# Patient Record
Sex: Female | Born: 1985 | Race: White | Hispanic: No | Marital: Married | Smoking: Never smoker
Health system: Southern US, Community
[De-identification: ages and names within clinical notes are randomized; demographics above are authoritative.]

## PROBLEM LIST (undated history)

## (undated) DIAGNOSIS — F418 Other specified anxiety disorders: Secondary | ICD-10-CM

## (undated) DIAGNOSIS — E669 Obesity, unspecified: Secondary | ICD-10-CM

## (undated) DIAGNOSIS — I82409 Acute embolism and thrombosis of unspecified deep veins of unspecified lower extremity: Secondary | ICD-10-CM

## (undated) DIAGNOSIS — K219 Gastro-esophageal reflux disease without esophagitis: Secondary | ICD-10-CM

## (undated) DIAGNOSIS — I2699 Other pulmonary embolism without acute cor pulmonale: Secondary | ICD-10-CM

## (undated) DIAGNOSIS — D682 Hereditary deficiency of other clotting factors: Secondary | ICD-10-CM

## (undated) HISTORY — PX: PERIPHERAL VASCULAR THROMBECTOMY: CATH118306

## (undated) HISTORY — PX: INSERTION OF VENA CAVA FILTER: SHX5871

---

## 2015-03-17 DIAGNOSIS — I82409 Acute embolism and thrombosis of unspecified deep veins of unspecified lower extremity: Secondary | ICD-10-CM

## 2015-03-17 HISTORY — DX: Acute embolism and thrombosis of unspecified deep veins of unspecified lower extremity: I82.409

## 2015-04-07 ENCOUNTER — Encounter (HOSPITAL_COMMUNITY): Payer: Self-pay | Admitting: *Deleted

## 2015-04-07 ENCOUNTER — Inpatient Hospital Stay (HOSPITAL_COMMUNITY)
Admission: AD | Admit: 2015-04-07 | Discharge: 2015-04-12 | DRG: 175 | Disposition: A | Discharge status: Home / Self Care | Payer: Medicaid Other | Source: Other Acute Inpatient Hospital | Attending: Internal Medicine | Admitting: Internal Medicine

## 2015-04-07 DIAGNOSIS — I82431 Acute embolism and thrombosis of right popliteal vein: Secondary | ICD-10-CM | POA: Diagnosis present

## 2015-04-07 DIAGNOSIS — Z79899 Other long term (current) drug therapy: Secondary | ICD-10-CM

## 2015-04-07 DIAGNOSIS — R300 Dysuria: Secondary | ICD-10-CM | POA: Diagnosis present

## 2015-04-07 DIAGNOSIS — I2699 Other pulmonary embolism without acute cor pulmonale: Principal | ICD-10-CM | POA: Diagnosis present

## 2015-04-07 DIAGNOSIS — I82411 Acute embolism and thrombosis of right femoral vein: Secondary | ICD-10-CM | POA: Diagnosis not present

## 2015-04-07 DIAGNOSIS — R000 Tachycardia, unspecified: Secondary | ICD-10-CM | POA: Diagnosis present

## 2015-04-07 DIAGNOSIS — R0902 Hypoxemia: Secondary | ICD-10-CM | POA: Diagnosis present

## 2015-04-07 DIAGNOSIS — I82401 Acute embolism and thrombosis of unspecified deep veins of right lower extremity: Secondary | ICD-10-CM

## 2015-04-07 DIAGNOSIS — I82220 Acute embolism and thrombosis of inferior vena cava: Secondary | ICD-10-CM | POA: Diagnosis present

## 2015-04-07 DIAGNOSIS — R0600 Dyspnea, unspecified: Secondary | ICD-10-CM | POA: Diagnosis present

## 2015-04-07 DIAGNOSIS — Z793 Long term (current) use of hormonal contraceptives: Secondary | ICD-10-CM

## 2015-04-07 DIAGNOSIS — F418 Other specified anxiety disorders: Secondary | ICD-10-CM | POA: Diagnosis present

## 2015-04-07 DIAGNOSIS — Z6841 Body Mass Index (BMI) 40.0 and over, adult: Secondary | ICD-10-CM | POA: Diagnosis not present

## 2015-04-07 DIAGNOSIS — R0602 Shortness of breath: Secondary | ICD-10-CM | POA: Diagnosis not present

## 2015-04-07 DIAGNOSIS — I82409 Acute embolism and thrombosis of unspecified deep veins of unspecified lower extremity: Secondary | ICD-10-CM | POA: Diagnosis present

## 2015-04-07 DIAGNOSIS — I471 Supraventricular tachycardia: Secondary | ICD-10-CM | POA: Diagnosis not present

## 2015-04-07 HISTORY — DX: Gastro-esophageal reflux disease without esophagitis: K21.9

## 2015-04-07 HISTORY — DX: Other specified anxiety disorders: F41.8

## 2015-04-07 HISTORY — DX: Obesity, unspecified: E66.9

## 2015-04-07 MED ORDER — BUPROPION HCL ER (XL) 150 MG PO TB24
150.0000 mg | ORAL_TABLET | Freq: Every day | ORAL | Status: DC
Start: 2015-04-07 — End: 2015-04-12
  Administered 2015-04-08 – 2015-04-12 (×5): 150 mg via ORAL
  Filled 2015-04-07 (×5): qty 1

## 2015-04-07 MED ORDER — SERTRALINE HCL 100 MG PO TABS
100.0000 mg | ORAL_TABLET | Freq: Every day | ORAL | Status: DC
  Administered 2015-04-08 – 2015-04-12 (×5): 100 mg via ORAL
  Filled 2015-04-07 (×5): qty 1

## 2015-04-07 MED ORDER — SODIUM CHLORIDE 0.9 % IJ SOLN
3.0000 mL | Freq: Two times a day (BID) | INTRAMUSCULAR | Status: DC
  Administered 2015-04-07 – 2015-04-12 (×6): 3 mL via INTRAVENOUS

## 2015-04-07 MED ORDER — MAGNESIUM CITRATE PO SOLN
1.0000 | Freq: Once | ORAL | Status: AC
  Administered 2015-04-07: 1 via ORAL
  Filled 2015-04-07: qty 296

## 2015-04-07 MED ORDER — SODIUM CHLORIDE 0.9 % IV SOLN
INTRAVENOUS | Status: AC
  Administered 2015-04-07 – 2015-04-08 (×2): via INTRAVENOUS
  Administered 2015-04-08: 100 mL/h via INTRAVENOUS

## 2015-04-07 MED ORDER — HYDROMORPHONE HCL 1 MG/ML IJ SOLN
1.0000 mg | Freq: Once | INTRAMUSCULAR | Status: AC
  Administered 2015-04-07: 1 mg via INTRAVENOUS

## 2015-04-07 MED ORDER — ONDANSETRON HCL 4 MG/2ML IJ SOLN
4.0000 mg | Freq: Four times a day (QID) | INTRAMUSCULAR | Status: DC | PRN
  Administered 2015-04-07: 4 mg via INTRAVENOUS
  Filled 2015-04-07 (×2): qty 2

## 2015-04-07 MED ORDER — OXYCODONE HCL 5 MG PO TABS
5.0000 mg | ORAL_TABLET | ORAL | Status: DC | PRN
  Administered 2015-04-08 – 2015-04-12 (×13): 5 mg via ORAL
  Filled 2015-04-07 (×13): qty 1

## 2015-04-07 MED ORDER — HYDROMORPHONE HCL 1 MG/ML IJ SOLN
1.0000 mg | Freq: Once | INTRAMUSCULAR | Status: DC
  Filled 2015-04-07 (×2): qty 1

## 2015-04-07 MED ORDER — NITROFURANTOIN MONOHYD MACRO 100 MG PO CAPS
100.0000 mg | ORAL_CAPSULE | Freq: Two times a day (BID) | ORAL | Status: DC
  Administered 2015-04-08 – 2015-04-12 (×10): 100 mg via ORAL
  Filled 2015-04-07 (×13): qty 1

## 2015-04-07 MED ORDER — ENOXAPARIN SODIUM 150 MG/ML ~~LOC~~ SOLN
130.0000 mg | Freq: Two times a day (BID) | SUBCUTANEOUS | Status: DC
  Administered 2015-04-08: 130 mg via SUBCUTANEOUS
  Filled 2015-04-07 (×3): qty 1

## 2015-04-07 NOTE — H&P (Signed)
[facility] History and Physical  Yenifer Saccente AOZ:[id number] DOB: 17-Dec-1985 DOA: 04/07/2015  Referring physician: Transfer from [facility]. PCP: Gaye Alken, NP   Chief Complaint:   Right leg pain and swelling.  HPI: Kryssa Risenhoover is a 29 y.o. female with a past medical history of depression, anxiety, morbid obesity, who was transferred from [facility] for further management of pulmonary embolism and DVT. The patient states that since Wednesday last week, she started having right lower extremity pain, which got increasingly worse, then later was associated with pleuritic chest pain and dyspnea. 2 days ago, on Saturday, she went to [facility] and was diagnosed with the above-mentioned thromboembolic events.  She denies any recent traveling by plane or automobile, cigarette smoking, but she has 2 second-degree relatives with a history of thromboembolic events. She is also on hormonal-based birth control tablets. 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  When seen she was in no acute distress and denies any other new complaints.   Review of Systems:  Constitutional:  No weight loss, night sweats, Fevers, chills,  Positive fatigue.  HEENT:  No headaches, Difficulty swallowing,Tooth/dental problems,Sore throat,  No sneezing, itching, ear ache, nasal congestion, post nasal drip,  Cardio-vascular:  No chest pain, Orthopnea, PND, swelling in lower extremities, anasarca, dizziness, palpitations  GI:  No heartburn, indigestion, abdominal pain, nausea, vomiting, diarrhea, change in bowel habits, loss of appetite  Resp:  No shortness of breath with exertion or at rest. No excess mucus, no productive cough, No non-productive cough, No coughing up of blood.No change in color of mucus.No wheezing.No chest wall deformity  Skin:  no rash or lesions.  GU:  no dysuria, change in color of urine, no urgency or frequency. No  flank pain.  Musculoskeletal:  No joint pain or swelling. No decreased range of motion. No back pain.  Psych:  No change in mood or affect. No depression or anxiety. No memory loss.   History reviewed. No pertinent past medical history. History reviewed. No pertinent past surgical history. Social History:  reports that she has never smoked. She does not have any smokeless tobacco history on file. She reports that she does not drink alcohol or use illicit drugs.    History reviewed.  Glucose intolerance on her mother.  Prior to Admission medications   Medication Sig Start Date End Date Taking? Authorizing Provider  buPROPion (WELLBUTRIN XL) 150 MG 24 hr tablet Take 150 mg by mouth daily.   Yes Historical Provider, MD  sertraline (ZOLOFT) 100 MG tablet Take 100 mg by mouth daily.   Yes Historical Provider, MD   Physical Exam: Filed Vitals:   04/07/15 1909  BP: 127/72  Pulse: 130  Temp: 99.5 F (37.5 C)  TempSrc: Oral  Resp: 18  SpO2: 97%    Wt Readings from Last 3 Encounters:  No data found for Wt    General:  Appears calm and comfortable Eyes: PERRL, normal lids, irises & conjunctiva ENT: grossly normal hearing, lips & tongue mildly dry. Neck: no LAD, masses or thyromegaly Cardiovascular: Tachycardic, no m/r/g. No LE edema. Telemetry: Tachycardic at 120 bpm Respiratory: CTA bilaterally, no w/r/r. Normal respiratory effort. Abdomen: Obese, soft, positive suprapubic tenderness on palpation without guarding or rebound tenderness. Skin: no rash or induration seen on limited exam Musculoskeletal: grossly normal tone BUE/BLE Psychiatric: grossly normal mood and affect, speech fluent and appropriate Neurologic: grossly non-focal.  Labs on Admission:  04/05/2011 at [facility] WBC 11.5 Hemoglobin 11.5 Hematocrit 35.5 Platelets 255 Differential is unremarkable.  CMP had a total protein of 8.4 g/dL, but he was otherwise unremarkable. Urine analysis  showed bacteriuria.  Right lower extremity ultrasound. Positive for an extensive occlusive DVT in the right common femoral and popliteal veins.  CT angiogram of the chest. Pulmonary embolus involving the right main pulmonary artery extending into the segmental and subsegmental pulmonary arteries of the right upper, middle and lower lobes.  CTA of abdomen and pelvis. 1) Problem list small focus of nonocclusive thrombus in the IVC just below the level of the renal veins. 2) Right lower extremity DVT extends into the external iliac vein with nonocclusive thrombus visualized up to the level of the mid to proximal sternal iliac vein. 3) slight enlargement of small right pleural effusion. Stable peripheral consolidation in the posterior right lower lobe again likely reflects a small pulmonary infarct related to acute pulmonary embolism.   Echocardiogram at [facility] was normal.  EKG Normal sinus rhythm. Normal EKG.  Assessment/Plan Principal Problem:   Pulmonary embolism and infarction   DVT of lower extremity (deep venous thrombosis) The patient states that the Eliquis is not working, since she seems to be getting more edema and pain than what she had on admission Saturday. I will discontinue Eliquis and start subcutaneous Lovenox at therapeutic dose. Supplemental oxygen.  Telemetry monitoring. Per patient, the workup for hypercoagulable state was already sent by [facility] and should be available in several days. Consult pulmonology in a.m. to evaluate possible lung infarction. She should discontinue her current birth control tablets and consider nonhormonal based methods.  Active Problems:   Depression with anxiety Stable at this time. Continue Zoloft and bupropion.    Morbid obesity Patient stated that she has been losing weight, but she will redouble her efforts to lose more weight. This should decrease the amount of peripheral estrogen levels and decrease her risk  of more thromboembolic events in the future.    Dysuria I will start nitrofurantoin.     Code Status: Full code. DVT Prophylaxis: On therapeutic anticoagulation. Family Communication: Eliza, Green [phone]  Disposition Plan: Anticoagulation with Lovenox, consult pulmonology and discharge home once bridged to an oral anticoagulant.  Time spent: Over 60 minutes.  Bobette Mo [facility] Pager [phone].

## 2015-04-07 NOTE — Progress Notes (Signed)
Jamye Balicki [id number] Admission Data: 04/07/2015 6:45 PM Attending Provider: Marinda Elk, MD  WUJ:WJXB,JYN, NP Consults/ Treatment Team:    Shantal Roan is a 29 y.o. female patient admitted from ED awake, alert  & orientated  X 3,  No Order, VSS - Last menstrual period 03/17/2015.,no c/o shortness of breath, no c/o chest pain, no distress noted.    IV site WDL:  antecubital right, condition patent and no redness with a transparent dsg that's clean dry and intact.  Allergies:  Not on File   History reviewed. No pertinent past medical history.  History:  obtained from the patient. Tobacco/alcohol: denied none  Pt orientation to unit, room and routine. Information packet given to patient/family and safety video watched.  Admission INP armband ID verified with patient/family, and in place. SR up x 2, fall risk assessment complete with Patient and family verbalizing understanding of risks associated with falls. Pt verbalizes an understanding of how to use the call bell and to call for help before getting out of bed.  Skin, clean-dry- intact without evidence of bruising, or skin tears.   No evidence of skin break down noted on exam just bruises.     Will cont to monitor and assist as needed.  Jahkai Yandell Consuella Lose, RN 04/07/2015 6:45 PM

## 2015-04-08 ENCOUNTER — Inpatient Hospital Stay (HOSPITAL_COMMUNITY): Payer: Medicaid Other

## 2015-04-08 ENCOUNTER — Encounter (HOSPITAL_COMMUNITY): Payer: Self-pay | Admitting: Radiology

## 2015-04-08 DIAGNOSIS — R0902 Hypoxemia: Secondary | ICD-10-CM | POA: Diagnosis present

## 2015-04-08 DIAGNOSIS — R000 Tachycardia, unspecified: Secondary | ICD-10-CM | POA: Diagnosis present

## 2015-04-08 DIAGNOSIS — R300 Dysuria: Secondary | ICD-10-CM

## 2015-04-08 DIAGNOSIS — R0602 Shortness of breath: Secondary | ICD-10-CM | POA: Diagnosis present

## 2015-04-08 DIAGNOSIS — I471 Supraventricular tachycardia: Secondary | ICD-10-CM

## 2015-04-08 DIAGNOSIS — I82409 Acute embolism and thrombosis of unspecified deep veins of unspecified lower extremity: Secondary | ICD-10-CM | POA: Diagnosis present

## 2015-04-08 LAB — COMPREHENSIVE METABOLIC PANEL
ALK PHOS: 84 U/L (ref 38–126)
ALT: 14 U/L (ref 14–54)
ANION GAP: 10 (ref 5–15)
AST: 29 U/L (ref 15–41)
Albumin: 2.3 g/dL — ABNORMAL LOW (ref 3.5–5.0)
BUN: <5 mg/dL — ABNORMAL LOW (ref 6–20)
CALCIUM: 8.8 mg/dL — AB (ref 8.9–10.3)
CO2: 25 mmol/L (ref 22–32)
Chloride: 101 mmol/L (ref 101–111)
Creatinine, Ser: 0.83 mg/dL (ref 0.44–1.00)
Glucose, Bld: 105 mg/dL — ABNORMAL HIGH (ref 65–99)
Potassium: 3.9 mmol/L (ref 3.5–5.1)
SODIUM: 136 mmol/L (ref 135–145)
TOTAL PROTEIN: 7.1 g/dL (ref 6.5–8.1)
Total Bilirubin: 0.4 mg/dL (ref 0.3–1.2)

## 2015-04-08 LAB — HEPARIN LEVEL (UNFRACTIONATED)
Heparin Unfractionated: >2.2 IU/mL — ABNORMAL HIGH (ref 0.30–0.70)
Heparin Unfractionated: 1.84 [IU]/mL — ABNORMAL HIGH (ref 0.30–0.70)

## 2015-04-08 LAB — URINALYSIS, ROUTINE W REFLEX MICROSCOPIC
Bilirubin Urine: NEGATIVE
GLUCOSE, UA: NEGATIVE mg/dL
HGB URINE DIPSTICK: NEGATIVE
Ketones, ur: 40 mg/dL — AB
LEUKOCYTES UA: NEGATIVE
Nitrite: NEGATIVE
Protein, ur: NEGATIVE mg/dL
SPECIFIC GRAVITY, URINE: 1.034 — AB (ref 1.005–1.030)
Urobilinogen, UA: 0.2 mg/dL (ref 0.0–1.0)
pH: 7.5 (ref 5.0–8.0)

## 2015-04-08 LAB — CBC
HCT: 32 % — ABNORMAL LOW (ref 36.0–46.0)
HCT: 31.1 % — ABNORMAL LOW (ref 36.0–46.0)
HCT: 31.3 % — ABNORMAL LOW (ref 36.0–46.0)
HEMOGLOBIN: 10.1 g/dL — AB (ref 12.0–15.0)
Hemoglobin: 9.8 g/dL — ABNORMAL LOW (ref 12.0–15.0)
Hemoglobin: 9.9 g/dL — ABNORMAL LOW (ref 12.0–15.0)
MCH: 27 pg (ref 26.0–34.0)
MCH: 26.9 pg (ref 26.0–34.0)
MCH: 27 pg (ref 26.0–34.0)
MCHC: 31.6 g/dL (ref 30.0–36.0)
MCHC: 31.5 g/dL (ref 30.0–36.0)
MCHC: 31.6 g/dL (ref 30.0–36.0)
MCV: 85.6 fL (ref 78.0–100.0)
MCV: 85.4 fL (ref 78.0–100.0)
MCV: 85.5 fL (ref 78.0–100.0)
PLATELETS: 278 10*3/uL (ref 150–400)
Platelets: 254 10*3/uL (ref 150–400)
Platelets: 257 10*3/uL (ref 150–400)
RBC: 3.74 MIL/uL — AB (ref 3.87–5.11)
RBC: 3.64 MIL/uL — ABNORMAL LOW (ref 3.87–5.11)
RBC: 3.66 MIL/uL — ABNORMAL LOW (ref 3.87–5.11)
RDW: 13.6 % (ref 11.5–15.5)
RDW: 13.4 % (ref 11.5–15.5)
RDW: 13.4 % (ref 11.5–15.5)
WBC: 11.1 10*3/uL — AB (ref 4.0–10.5)
WBC: 10.8 10*3/uL — ABNORMAL HIGH (ref 4.0–10.5)
WBC: 12.5 10*3/uL — ABNORMAL HIGH (ref 4.0–10.5)

## 2015-04-08 LAB — FIBRINOGEN
FIBRINOGEN: 689 mg/dL — AB (ref 204–475)
Fibrinogen: 722 mg/dL — ABNORMAL HIGH (ref 204–475)
Fibrinogen: 697 mg/dL — ABNORMAL HIGH (ref 204–475)

## 2015-04-08 LAB — MRSA PCR SCREENING: MRSA BY PCR: NEGATIVE

## 2015-04-08 LAB — APTT
APTT: 42 s — AB (ref 24–37)
APTT: 43 s — AB (ref 24–37)

## 2015-04-08 LAB — GLUCOSE, CAPILLARY: Glucose-Capillary: 90 mg/dL (ref 65–99)

## 2015-04-08 LAB — PROTIME-INR
INR: 1.43 (ref 0.00–1.49)
Prothrombin Time: 17.6 seconds — ABNORMAL HIGH (ref 11.6–15.2)

## 2015-04-08 MED ORDER — PANTOPRAZOLE SODIUM 40 MG PO TBEC
40.0000 mg | DELAYED_RELEASE_TABLET | Freq: Every day | ORAL | Status: DC
  Administered 2015-04-08 – 2015-04-12 (×5): 40 mg via ORAL
  Filled 2015-04-08 (×5): qty 1

## 2015-04-08 MED ORDER — MORPHINE SULFATE (PF) 2 MG/ML IV SOLN
1.0000 mg | INTRAVENOUS | Status: DC | PRN
  Administered 2015-04-08 – 2015-04-12 (×5): 2 mg via INTRAVENOUS
  Filled 2015-04-08 (×5): qty 1

## 2015-04-08 MED ORDER — LIDOCAINE HCL 1 % IJ SOLN
INTRAMUSCULAR | Status: AC
  Filled 2015-04-08: qty 20

## 2015-04-08 MED ORDER — SODIUM CHLORIDE 0.9 % IJ SOLN
3.0000 mL | Freq: Two times a day (BID) | INTRAMUSCULAR | Status: DC
  Administered 2015-04-08 – 2015-04-12 (×7): 3 mL via INTRAVENOUS

## 2015-04-08 MED ORDER — HYDROMORPHONE HCL 1 MG/ML IJ SOLN
INTRAMUSCULAR | Status: AC
  Administered 2015-04-08: 1 mg
  Filled 2015-04-08: qty 1

## 2015-04-08 MED ORDER — SODIUM CHLORIDE 0.9 % IV SOLN: 250.0000 mL | INTRAVENOUS | Status: DC | PRN

## 2015-04-08 MED ORDER — IOHEXOL 300 MG/ML  SOLN
100.0000 mL | Freq: Once | INTRAMUSCULAR | Status: DC | PRN
  Administered 2015-04-08: 60 mL via INTRAVENOUS
  Filled 2015-04-08: qty 100

## 2015-04-08 MED ORDER — HEPARIN (PORCINE) IN NACL 100-0.45 UNIT/ML-% IJ SOLN
1900.0000 [IU]/h | INTRAMUSCULAR | Status: DC
  Administered 2015-04-08: 1000 [IU]/h via INTRAVENOUS
  Administered 2015-04-09: 1600 [IU]/h via INTRAVENOUS
  Administered 2015-04-09: 1750 [IU]/h via INTRAVENOUS
  Administered 2015-04-10: 1900 [IU]/h via INTRAVENOUS
  Filled 2015-04-08 (×8): qty 250

## 2015-04-08 MED ORDER — FENTANYL CITRATE (PF) 100 MCG/2ML IJ SOLN
INTRAMUSCULAR | Status: AC
  Administered 2015-04-08: 17:00:00
  Filled 2015-04-08: qty 2

## 2015-04-08 MED ORDER — FENTANYL CITRATE (PF) 100 MCG/2ML IJ SOLN
INTRAMUSCULAR | Status: AC
  Filled 2015-04-08: qty 2

## 2015-04-08 MED ORDER — ALUM & MAG HYDROXIDE-SIMETH 200-200-20 MG/5ML PO SUSP
30.0000 mL | Freq: Four times a day (QID) | ORAL | Status: DC | PRN
  Administered 2015-04-08: 30 mL via ORAL
  Filled 2015-04-08 (×2): qty 30

## 2015-04-08 MED ORDER — FENTANYL CITRATE (PF) 100 MCG/2ML IJ SOLN
INTRAMUSCULAR | Status: AC | PRN
  Administered 2015-04-08 (×2): 50 ug via INTRAVENOUS

## 2015-04-08 MED ORDER — SODIUM CHLORIDE 0.9 % IV SOLN
INTRAVENOUS | Status: DC
  Administered 2015-04-08 – 2015-04-09 (×3): via INTRAVENOUS

## 2015-04-08 MED ORDER — SODIUM CHLORIDE 0.9 % IJ SOLN: 3.0000 mL | INTRAMUSCULAR | Status: DC | PRN

## 2015-04-08 MED ORDER — MIDAZOLAM HCL 2 MG/2ML IJ SOLN
INTRAMUSCULAR | Status: AC | PRN
  Administered 2015-04-08 (×2): 1 mg via INTRAVENOUS

## 2015-04-08 MED ORDER — SODIUM CHLORIDE 0.9 % IV SOLN: INTRAVENOUS | Status: DC

## 2015-04-08 MED ORDER — SODIUM CHLORIDE 0.9 % IV SOLN
1.0000 mg/h | Freq: Once | INTRAVENOUS | Status: DC
  Administered 2015-04-08: 1 mg/h via INTRAVENOUS
  Filled 2015-04-08: qty 24

## 2015-04-08 MED ORDER — MIDAZOLAM HCL 2 MG/2ML IJ SOLN
INTRAMUSCULAR | Status: AC
  Filled 2015-04-08: qty 2

## 2015-04-08 MED ORDER — HEPARIN SODIUM (PORCINE) 1000 UNIT/ML IJ SOLN
INTRAMUSCULAR | Status: AC
  Filled 2015-04-08: qty 1

## 2015-04-08 NOTE — Progress Notes (Signed)
[facility] PROGRESS NOTE  Christina Simmons WUJ:[id number] DOB: [birth year] DOA: 04/07/2015 PCP: Gaye Alken, NP  Brief narrative 29 year old morbidly obese female with history of anxiety depression, on OCP for past 15 years was transferred from [facility] after being admitted there on 8/24 with symptoms of dyspnea and pleuritic chest pain extensive right lower leg DVT (extending into the external iliac along with a nonocclusive thrombus of the lower IVC), extensive right sided PE (right main and subsegmental branches with right lower lobe infarct and small left lower lobe PE). Patient initially placed on IV heparin and started on eliquis. Family history of blood clots in her grandparents. CT evaluated by radiologist Dr Fredia Sorrow and [facility]  Patient on admission placed on therapeutic lovenox. Patient since admission denies any dyspnea but is tachycardic  from 120-150 on the monitor overnight. Repeat blood work pending. Reportedly hypercoagulable workup has been sent at [facility].  2 D ECHO done at [facility] show any rt heart strain.   Assessment/Plan: Extensive right lower leg DVT with bilateral PE (R>>L) Patient tachycardic in 120s-130s this morning. We'll transfer her to ICU with plan on thrombolysis of extensive right lower leg DVT and possible IVC placement. Hold off on further lovenox dosing. D/w PCCM( Dr Craige Cotta) , agree with transfer to ICU. D/w IR on planned procedure. Pain control with when necessary oxycodone and IV  morphine .  Active problems Anxiety and depression Resume home medications  Dysuria Started on nitrofurantoin. Check UA and urine culture.  Morbid obesity  Diet: Nothing by mouth    Code Status: Full code Family Communication: Husband at bedside Disposition Plan: Transfer to ICU   Consultants:  PCCM   IR  Procedures:  For tPA of extensive DVT/  ?IVC  Antibiotics:  nitrofurantoin  HPI/Subjective: Admission H&P reviewed. Patient complains of severe pain in her right leg and right lower quadrant. Denies shortness of breath but has been tachycardic on the monitor.  Objective: Filed Vitals:   04/07/15 2143  BP: 125/70  Pulse: 125  Temp: 99.6 F (37.6 C)  Resp:     Intake/Output Summary (Last 24 hours) at 04/08/15 9562 Last data filed at 04/08/15 0700  Gross per 24 hour  Intake      0 ml  Output    400 ml  Net   -400 ml   Filed Weights   04/07/15 2245 04/08/15 0135  Weight: 129.275 kg (285 lb) 129.275 kg (285 lb)    Exam:   General:  Young obese female in some distress with pain  HEENT: No pallor, moist oral mucosa  Chest: Clear to auscultation bilaterally  CVS: S1 and S2 tachycardic, no murmurs rub or gallop  GI: Soft, nondistended, right lower quadrant tenderness, bowel sounds present  Musculoskeletal: Swollen right leg with tender to pressure over the thigh and the calves, distal pulses palpable  CNS: Alert and oriented    Data Reviewed: Basic Metabolic Panel: No results for input(s): NA, K, CL, CO2, GLUCOSE, BUN, CREATININE, CALCIUM, MG, PHOS in the last 168 hours. Liver Function Tests: No results for input(s): AST, ALT, ALKPHOS, BILITOT, PROT, ALBUMIN in the last 168 hours. No results for input(s): LIPASE, AMYLASE in the last 168 hours. No results for input(s): AMMONIA in the last 168 hours. CBC: No results for input(s): WBC, NEUTROABS, HGB, HCT, MCV, PLT in the last 168 hours. Cardiac Enzymes: No results for input(s): CKTOTAL, CKMB, CKMBINDEX, TROPONINI in the last 168 hours. BNP (last 3 results) No results  for input(s): BNP in the last 8760 hours.  ProBNP (last 3 results) No results for input(s): PROBNP in the last 8760 hours.  CBG: No results for input(s): GLUCAP in the last 168 hours.  No results found for this or any previous visit (from the past 240 hour(s)).   Studies: No  results found.  Scheduled Meds: . buPROPion  150 mg Oral Daily  . enoxaparin (LOVENOX) injection  130 mg Subcutaneous BID  .  HYDROmorphone (DILAUDID) injection  1 mg Intravenous Once  . nitrofurantoin (macrocrystal-monohydrate)  100 mg Oral Q12H  . pantoprazole  40 mg Oral Daily  . sertraline  100 mg Oral Daily  . sodium chloride  3 mL Intravenous Q12H   Continuous Infusions: . sodium chloride 100 mL/hr at 04/07/15 2147    Principal Problem:   Pulmonary embolism and infarction Active Problems:   DVT of lower extremity (deep venous thrombosis)   Depression with anxiety   Morbid obesity   Dysuria    Time spent: 35 minutes    Othel Hoogendoorn  [facility] Pager [phone] If 7PM-7AM, please contact night-coverage at [email or web address], password [facility] 04/08/2015, 8:29 AM  LOS: 1 day

## 2015-04-08 NOTE — Progress Notes (Signed)
Utilization review completed. Crimson Dubberly, RN, BSN. 

## 2015-04-08 NOTE — Sedation Documentation (Signed)
Patient is resting comfortably. 

## 2015-04-08 NOTE — Sedation Documentation (Signed)
Patient c/o pain in R leg

## 2015-04-08 NOTE — Procedures (Signed)
Successful insertion of an EKOS [place] assisted infusion catheter into the right leg DVT via popliteal vein for thrombolysis No comp Stable Begin at /hr followup venogram after at least 18 hrs of lysis Full report in PACS

## 2015-04-08 NOTE — Progress Notes (Signed)
Patient made NPO.  Explain that radiology was going to come up and consent her for the procedure patient verbalize understanding

## 2015-04-08 NOTE — Progress Notes (Signed)
ANTICOAGULATION CONSULT NOTE - Follow Up  Pharmacy Consult for Heparin Indication: pulmonary embolus and DVT for thrombolysis  Allergies  Allergen Reactions  . Amoxicillin     Childhood    Patient Measurements: Height:  (162.6 cm) Weight: 293 lb 10.4 oz (133.2 kg) IBW/kg (Calculated) : 54.7 Heparin Dosing Weight: 86.6 kg  Vital Signs: Temp: 98.7 F (37.1 C) (08/23 1111) Temp Source: Oral (08/23 1111) BP: 118/63 mmHg (08/23 1900) Pulse Rate: 115 (08/23 1900)  Labs:  Recent Labs  04/08/15 0808 04/08/15 1146 04/08/15 1612 04/08/15 1849  HGB 10.1*  --  9.8*  --   HCT 32.0*  --  31.1*  --   PLT 278  --  254  --   APTT  --  42*  --  43*  LABPROT  --  17.6*  --   --   INR  --  1.43  --   --   HEPARINUNFRC  --  >2.20*  --   --   CREATININE 0.83  --   --   --     Estimated Creatinine Clearance: 135.9 mL/min (by C-G formula based on Cr of 0.83).  Medical History: Past Medical History  Diagnosis Date  . Obesity   . GERD (gastroesophageal reflux disease)   . Depression with anxiety    Medications:  Scheduled:  . alteplase (tPA / ACTIVASE) PE Lysis 24 mg/500 mL (UNILATERAL)  1 mg/hr Intravenous Once  . buPROPion  150 mg Oral Daily  . fentaNYL      .  HYDROmorphone (DILAUDID) injection  1 mg Intravenous Once  . lidocaine      . midazolam      . nitrofurantoin (macrocrystal-monohydrate)  100 mg Oral Q12H  . pantoprazole  40 mg Oral Daily  . sertraline  100 mg Oral Daily  . sodium chloride  3 mL Intravenous Q12H  . sodium chloride  3 mL Intravenous Q12H    Assessment: 29 yo F initially presented to [facility] with DVT/PE and was started on Eliquis.  Pt was transferred to [facility] for consideration of thrombolysis.  Pt has been seen by IR who is recommending thrombolytic therapy for extensive RLE DVT.  On admission to [facility], pt was started on Lovenox /kg - last dose at midnight.  Will transition to heparin for VTE treatment while receiving thrombolytic  therapy.  VTE risk factors >> obesity, oral contraceptive use, family hx of clot  Initial aPTT is 43 sec on rate of 1000 units/hr.  She is s/p IR where she received thrombolytics with the plan to ("continue existing heparin infusion per pharmacy and maintain heparin level at 0.3-0.7 units/mL with frequent monitoring. Plan for stopping for approximately 4 hours at the end of 24 hr tPA treatment for IR procedure and sheath removal") per MD.  Goal of Therapy:  HL 0.3-0.7 per MD (aPTT 66-102 sec) (would strive to keep on lower end with thrombolytics Monitor platelets by anticoagulation protocol: Yes   Plan:  Will increase IV heparin to 1200 units/hr.   Obtain aPTT 6 hours after rate change Daily aPTT for now - will change to HL in a few days  Nadara Mustard, PharmD., MS Clinical Pharmacist Pager:  [phone] Thank you for allowing pharmacy to be part of this patients care team. 04/08/2015 7:24 PM

## 2015-04-08 NOTE — Progress Notes (Signed)
ANTICOAGULATION CONSULT NOTE - Initial Consult  Pharmacy Consult for Heparin Indication: pulmonary embolus and DVT for thrombolysis  Allergies  Allergen Reactions  . Amoxicillin     Childhood     Patient Measurements: Height:  (162.6 cm) Weight: 285 lb (129.275 kg) IBW/kg (Calculated) : 54.7 Heparin Dosing Weight: 86.6 kg  Vital Signs:    Labs:  Recent Labs  04/08/15 0808  HGB 10.1*  HCT 32.0*  PLT 278  CREATININE 0.83    Estimated Creatinine Clearance: 133.4 mL/min (by C-G formula based on Cr of 0.83).   Medical History: Past Medical History  Diagnosis Date  . Obesity   . GERD (gastroesophageal reflux disease)   . Depression with anxiety     Medications:  Scheduled:  . buPROPion  150 mg Oral Daily  .  HYDROmorphone (DILAUDID) injection  1 mg Intravenous Once  . nitrofurantoin (macrocrystal-monohydrate)  100 mg Oral Q12H  . pantoprazole  40 mg Oral Daily  . sertraline  100 mg Oral Daily  . sodium chloride  3 mL Intravenous Q12H    Assessment: 29 yo F initially presented to [facility] with DVT/PE and was started on Eliquis.  Pt was transferred to [facility] for consideration of thrombolysis.  Pt has been seen by IR who is recommending thrombolytic therapy for extensive RLE DVT.  On admission to [facility], pt was started on Lovenox /kg - last dose at midnight.  Will transition to heparin for VTE treatment while receiving thrombolytic therapy.  VTE risk factors >> obesity, oral contraceptive use, family hx of clot  Goal of Therapy:  Heparin level 0.2-0.5 units/ml Monitor platelets by anticoagulation protocol: Yes   Plan:  Will order baseline heparin level and aPTT to assist with future Heparin dosing.  Dosing by heparin levels maybe complicated by Eliquis and Lovenox administration.  At noon, begin heparin at 1000 units/hr. Follow-up plans for initiation of thrombolytic. Heparin level at 1800. Heparin level and CBC daily.  [employer], Pharm.D.,  BCPS Clinical Pharmacist Pager [phone] 04/08/2015 11:20 AM

## 2015-04-08 NOTE — Progress Notes (Signed)
Chief Complaint: Patient was seen in consultation today for thrombolysis of right LE DVT at the request of Dr. Gonzella Lex  Referring Physician(s): Dr. Gonzella Lex  History of Present Illness: Christina Simmons is a 29 y.o. female who developed right LE pain and swelling about a week ago. This progressed to SOB, dyspnea and she went to [facility] ER. Was dx with Extensive RLE DVT and right sided PE. She has been started on Lovenox. After review of her CT, it was suggested she be transferred to [facility] for consideration of thrombolysis. Pt has no prior hx of DVT/PE but reports family hx. She is in nursing school but it otherwise active. She is not a smoker. No other known risk factors for VTE. She has been on Lovenox with minimal relief. She currently denies CP, SOB, but does still c/o right LE pain and swelling.  IR is asked to eval for catheter directed thrombolysis  Past Medical History  Diagnosis Date  . Obesity   . GERD (gastroesophageal reflux disease)   . Depression with anxiety     History reviewed. No pertinent past surgical history.  Allergies: Amoxicillin  Medications: Prior to Admission medications   Medication Sig Start Date End Date Taking? Authorizing Provider  buPROPion (WELLBUTRIN XL) 150 MG 24 hr tablet Take 150 mg by mouth daily.   Yes Historical Provider, MD  sertraline (ZOLOFT) 100 MG tablet Take 100 mg by mouth daily.   Yes Historical Provider, MD     History reviewed. No pertinent family history.  Social History   Social History  . Marital Status: Married    Spouse Name: N/A  . Number of Children: N/A  . Years of Education: N/A   Social History Main Topics  . Smoking status: Never Smoker   . Smokeless tobacco: None  . Alcohol Use: No  . Drug Use: No  . Sexual Activity: Yes    Birth Control/ Protection: Pill   Other Topics Concern  . None   Social History Narrative     Review of Systems: A 12 point ROS discussed and pertinent positives are  indicated in the HPI above.  All other systems are negative.  Review of Systems  Vital Signs: BP 125/70 mmHg  Pulse 125  Temp(Src) 99.6 F (37.6 C) (Oral)  Resp 18  Ht 5\' 4"  (1.626 m)  Wt 285 lb (129.275 kg)  BMI 48.90 kg/m2  SpO2 97%  LMP 03/17/2015  Physical Exam  Constitutional: She is oriented to person, place, and time.  Morbidly obese female, NAD.  HENT:  Head: Normocephalic.  Mouth/Throat: Oropharynx is clear and moist.  Neck: Normal range of motion. No JVD present. No tracheal deviation present.  Cardiovascular: Regular rhythm, normal heart sounds and intact distal pulses.   Tachycardic   Pulmonary/Chest: Effort normal and breath sounds normal. No respiratory distress. She has no wheezes.  Abdominal: Soft. She exhibits no distension. There is no tenderness.  Musculoskeletal: She exhibits tenderness.  2-3+ nonpitting edema of RLE. Tender to palpation of calf and thigh  Neurological: She is alert and oriented to person, place, and time.  Psychiatric: She has a normal mood and affect. Judgment normal.    Mallampati Score:  MD Evaluation Airway: WNL Heart: WNL Abdomen: WNL Chest/ Lungs: WNL ASA  Classification: 2 Mallampati/Airway Score: Two  Imaging: No results found.  Labs:  CBC:  Recent Labs  04/08/15 0808  WBC 11.1*  HGB 10.1*  HCT 32.0*  PLT 278    COAGS: No results for  input(s): INR, APTT in the last 8760 hours.  BMP:  Recent Labs  04/08/15 0808  NA 136  K 3.9  CL 101  CO2 25  GLUCOSE 105*  BUN <5*  CALCIUM 8.8*  CREATININE 0.83  GFRNONAA >60  GFRAA >60    LIVER FUNCTION TESTS:  Recent Labs  04/08/15 0808  BILITOT 0.4  AST 29  ALT 14  ALKPHOS 84  PROT 7.1  ALBUMIN 2.3*    Assessment and Plan: Right PE, no heart strain per CT and Echo Extensive (R)LE DVT from popliteal to distal IVC Pt with significant edema and symptoms from RLE DVT Given sxs and young age, pt is good candidate for catheter directed  thrombolysis to expedite symptomatic relief and reduce risk of post phlebitic syndrome. May require angioplasty/stent. Distal IVC stranding thrombus, but unlikely to need temp IVC filter. Risks and Benefits discussed with the patient including, but not limited to bleeding, possible life threatening bleeding and need for blood product transfusion, vascular injury, stroke, contrast induced renal failure, limb loss and infection. All of the patient's questions were answered, patient is agreeable to proceed. Consent signed and in chart.    Thank you for this interesting consult.  I greatly enjoyed meeting [employer] and look forward to participating in their care.  A copy of this report was sent to the requesting provider on this date.  SignedBrayton El 04/08/2015, 9:39 AM   I spent a total of 40 minutes in clinical consultation, greater than 50% of which was face to face counseling/coordinating care for catheter directed thrombolysis of Right LE DVT.

## 2015-04-08 NOTE — Consult Note (Signed)
Name: Christina Simmons MRN: [id number] DOB: 1985/12/04    ADMISSION DATE:  04/07/2015 CONSULTATION DATE:  8/23  REFERRING MD : [facility]  CHIEF COMPLAINT:  SOB  BRIEF PATIENT DESCRIPTION: MOWF with rt dvt and Pe  SIGNIFICANT EVENTS    STUDIES:  8/23 IR>>   HISTORY OF PRESENT ILLNESS:  29 yo wf never smoker, blood clots on both sides of family, who noted back and left hip pain x 1 month. Reports she felt "gurgling" in her back. One episode of SOB that resolved. 6 days ago noted rt leg pain, swelling and heat. See at [facility] Saturday and found to have R lower ext dvt and PE. And is for lysis today per IR. Do to extensive nature of clots she will be placed in ICU for safety issue. PCCM asked to consult. Note she was on [facility] pills  PAST MEDICAL HISTORY :   has a past medical history of Obesity; GERD (gastroesophageal reflux disease); and Depression with anxiety.  has no past surgical history on file. Prior to Admission medications   Medication Sig Start Date End Date Taking? Authorizing Provider  buPROPion (WELLBUTRIN XL) 150 MG 24 hr tablet Take 150 mg by mouth daily.   Yes Historical Provider, MD  sertraline (ZOLOFT) 100 MG tablet Take 100 mg by mouth daily.   Yes Historical Provider, MD   Allergies  Allergen Reactions  . Amoxicillin     Childhood     FAMILY HISTORY:  family history is not on file. SOCIAL HISTORY:  reports that she has never smoked. She does not have any smokeless tobacco history on file. She reports that she does not drink alcohol or use illicit drugs.  REVIEW OF SYSTEMS:   10 point review of system taken, please see HPI for positives and negatives.  SUBJECTIVE:   VITAL SIGNS: Temp:  [99.5 F (37.5 C)-99.6 F (37.6 C)] 99.6 F (37.6 C) (08/22 2143) Pulse Rate:  [125-130] 125 (08/22 2143) Resp:  [18] 18 (08/22 1909) BP: (125-127)/(70-72) 125/70 mmHg (08/22 2143) SpO2:  [97 %] 97 % (08/22 2143) Weight:  [285 lb (129.275 kg)] 285 lb (129.275  kg) (08/23 0135)  PHYSICAL EXAMINATION: General:  MOWF NAD Neuro:  Intact HEENT:  No JVD/LAN, short neck Cardiovascular:  hsr rrr Lungs:  CTA Abdomen:  Obese + bs Musculoskeletal:  Intact Skin:  Rt lower ext edematous, hot, tender    Recent Labs Lab 04/08/15 0808  NA 136  K 3.9  CL 101  CO2 25  BUN <5*  CREATININE 0.83  GLUCOSE 105*    Recent Labs Lab 04/08/15 0808  HGB 10.1*  HCT 32.0*  WBC 11.1*  PLT 278   No results found. Principal Problem:   Pulmonary embolism and infarction Active Problems:   DVT of lower extremity (deep venous thrombosis)   Depression with anxiety   Morbid obesity   Dysuria   DVT (deep venous thrombosis)   SOB (shortness of breath) Discussion: 29 yo wf never smoker, blood clots on both sides of family, who noted back and left hip pain x 1 month. Reports she felt "gurgling" in her back. One episode of SOB that resolved. 6 days ago noted rt leg pain, swelling and heat. See at [facility] Saturday and found to have R lower ext dvt and PE. And is for lysis today per IR. Do to extensive nature of clots she will be placed in ICU for safety issue. PCCM asked to consult. Note she was on [facility] pills  ASSESSMENT   Thromboembolic dz GERD Anxiety   PLAN: Lysis per IR 8/23 ICU transferred 8/23 Anticoagulation Hematology work up in future   [place] Minor ACNP Adolph Pollack PCCM Pager [phone] till 3 pm If no answer page [phone] 04/08/2015, 10:59 AM   Attending Note:  29 year old female with family history on both sides of blood clots (DVT's, PE and intracardiac clots) who presents with lower ext swelling.  Noted have extensive DVT and was seen by IR.  I reviewed CTA myself, evidence of PE.  Transferred to the ICU for EKOS procedure and tPA.  PCCM to assume care.  Discussed with [facility]-MD and PCCM-NP.  DVT:   - EKOS.  - tPA via EKOS.  - F/U imaging.  PE:  - If unable to address DVT then may need to place filter.  - Anticoagulation via  heparin.  - Will need to discuss coumadin vs newer oral anti-coagulants.  FH of clots:  - Hypercoagulable panel sent in [place].  - Will follow up on above.  - Anti-coag as above.  - May need anti-coag for life and patient has been made aware of that.  Sinus tach: due to PE.  - Anti-coag.  - No beta blockers.  Hypoxemia: due to PE.  - Titrate O2 for sat of 92-95%.  - F/U on echo.  Patient seen and examined, agree with above note.  I dictated the care and orders written for this patient under my direction.  Alyson Reedy, MD [phone].

## 2015-04-09 ENCOUNTER — Inpatient Hospital Stay (HOSPITAL_COMMUNITY): Payer: Medicaid Other

## 2015-04-09 LAB — CBC
HCT: 30.2 % — ABNORMAL LOW (ref 36.0–46.0)
HCT: 30.6 % — ABNORMAL LOW (ref 36.0–46.0)
Hemoglobin: 9.6 g/dL — ABNORMAL LOW (ref 12.0–15.0)
Hemoglobin: 9.6 g/dL — ABNORMAL LOW (ref 12.0–15.0)
MCH: 27.1 pg (ref 26.0–34.0)
MCH: 26.9 pg (ref 26.0–34.0)
MCHC: 31.8 g/dL (ref 30.0–36.0)
MCHC: 31.4 g/dL (ref 30.0–36.0)
MCV: 85.3 fL (ref 78.0–100.0)
MCV: 85.7 fL (ref 78.0–100.0)
PLATELETS: 239 10*3/uL (ref 150–400)
PLATELETS: 239 10*3/uL (ref 150–400)
RBC: 3.54 MIL/uL — AB (ref 3.87–5.11)
RBC: 3.57 MIL/uL — AB (ref 3.87–5.11)
RDW: 13.4 % (ref 11.5–15.5)
RDW: 13.3 % (ref 11.5–15.5)
WBC: 10.9 10*3/uL — AB (ref 4.0–10.5)
WBC: 9.7 10*3/uL (ref 4.0–10.5)

## 2015-04-09 LAB — COMPREHENSIVE METABOLIC PANEL
ALT: 16 U/L (ref 14–54)
ANION GAP: 9 (ref 5–15)
AST: 23 U/L (ref 15–41)
Albumin: 2 g/dL — ABNORMAL LOW (ref 3.5–5.0)
Alkaline Phosphatase: 73 U/L (ref 38–126)
BUN: 5 mg/dL — ABNORMAL LOW (ref 6–20)
CHLORIDE: 99 mmol/L — AB (ref 101–111)
CO2: 24 mmol/L (ref 22–32)
Calcium: 7.9 mg/dL — ABNORMAL LOW (ref 8.9–10.3)
Creatinine, Ser: 0.68 mg/dL (ref 0.44–1.00)
GFR calc non Af Amer: >60 mL/min (ref >60)
Glucose, Bld: 84 mg/dL (ref 65–99)
POTASSIUM: 3.8 mmol/L (ref 3.5–5.1)
Sodium: 132 mmol/L — ABNORMAL LOW (ref 135–145)
Total Bilirubin: 0.9 mg/dL (ref 0.3–1.2)
Total Protein: 6.2 g/dL — ABNORMAL LOW (ref 6.5–8.1)

## 2015-04-09 LAB — HEPARIN LEVEL (UNFRACTIONATED)
Heparin Unfractionated: 1.1 IU/mL — ABNORMAL HIGH (ref 0.30–0.70)
Heparin Unfractionated: 0.92 IU/mL — ABNORMAL HIGH (ref 0.30–0.70)

## 2015-04-09 LAB — URINE CULTURE: Culture: NO GROWTH

## 2015-04-09 LAB — APTT
aPTT: 37 seconds (ref 24–37)
aPTT: 61 seconds — ABNORMAL HIGH (ref 24–37)
aPTT: 69 seconds — ABNORMAL HIGH (ref 24–37)

## 2015-04-09 LAB — FIBRINOGEN
FIBRINOGEN: 631 mg/dL — AB (ref 204–475)
FIBRINOGEN: 463 mg/dL (ref 204–475)

## 2015-04-09 MED ORDER — MIDAZOLAM HCL 2 MG/2ML IJ SOLN
INTRAMUSCULAR | Status: AC | PRN
  Administered 2015-04-09 (×2): 1 mg via INTRAVENOUS

## 2015-04-09 MED ORDER — LIDOCAINE HCL 1 % IJ SOLN
INTRAMUSCULAR | Status: AC
  Filled 2015-04-09: qty 20

## 2015-04-09 MED ORDER — FENTANYL CITRATE (PF) 100 MCG/2ML IJ SOLN
INTRAMUSCULAR | Status: AC | PRN
  Administered 2015-04-09 (×2): 50 ug via INTRAVENOUS

## 2015-04-09 MED ORDER — FENTANYL CITRATE (PF) 100 MCG/2ML IJ SOLN
INTRAMUSCULAR | Status: AC
  Filled 2015-04-09: qty 4

## 2015-04-09 MED ORDER — MIDAZOLAM HCL 2 MG/2ML IJ SOLN
INTRAMUSCULAR | Status: AC
  Filled 2015-04-09: qty 4

## 2015-04-09 NOTE — Progress Notes (Addendum)
ANTICOAGULATION CONSULT NOTE  Pharmacy Consult for Heparin Indication: pulmonary embolus and DVT for thrombolysis  Allergies  Allergen Reactions  . Amoxicillin     Childhood    Patient Measurements: Height:  (162.6 cm) Weight: 293 lb 10.4 oz (133.2 kg) IBW/kg (Calculated) : 54.7 Heparin Dosing Weight: 86.6 kg  Vital Signs: Temp: 98.9 F (37.2 C) (08/24 0025) Temp Source: Oral (08/24 0025) BP: 99/52 mmHg (08/24 0100) Pulse Rate: 111 (08/24 0100)  Labs:  Recent Labs  04/08/15 0808 04/08/15 1146 04/08/15 1612 04/08/15 1848 04/08/15 1849 04/08/15 2226 04/09/15 0200  HGB 10.1*  --  9.8*  --   --  9.9*  --   HCT 32.0*  --  31.1*  --   --  31.3*  --   PLT 278  --  254  --   --  257  --   APTT  --  42*  --   --  43*  --  37  LABPROT  --  17.6*  --   --   --   --   --   INR  --  1.43  --   --   --   --   --   HEPARINUNFRC  --  >2.20*  --  1.84*  --   --  1.10*  CREATININE 0.83  --   --   --   --   --   --     Estimated Creatinine Clearance: 135.9 mL/min (by C-G formula based on Cr of 0.83).  Medical History: Past Medical History  Diagnosis Date  . Obesity   . GERD (gastroesophageal reflux disease)   . Depression with anxiety    Medications:  Scheduled:  . alteplase (tPA / ACTIVASE) PE Lysis 24 mg/500 mL (UNILATERAL)  1 mg/hr Intravenous Once  . buPROPion  150 mg Oral Daily  . fentaNYL      .  HYDROmorphone (DILAUDID) injection  1 mg Intravenous Once  . midazolam      . nitrofurantoin (macrocrystal-monohydrate)  100 mg Oral Q12H  . pantoprazole  40 mg Oral Daily  . sertraline  100 mg Oral Daily  . sodium chloride  3 mL Intravenous Q12H  . sodium chloride  3 mL Intravenous Q12H    Assessment: 29 yo Female with PE/DVT, EKOS [place] catheter in RT leg DVT, for heparin   Goal of Therapy:  HL 0.3-0.7 per MD (aPTT 66-102 sec) Monitor platelets by anticoagulation protocol: Yes   Plan:  Increase Heparin 1600 units/hr Check aPTT in 6 hours.  Geannie Risen,  PharmD, BCPS    04/09/2015 3:08 AM   ADDENDUM ======================================= Last Eliquis dose 8/22 AM  -Repeat aPTT =  61 (goal 66-102 sec), slightly subtherapeutic -Increased heparin to 1750 units/hr -Recheck aPTT and HL in 6 hrs (Goal HL 0.3-0.7)  Will aim for higher aPTT and HL goals now that EKOS TPA has been stopped.  [place] Jansky, PharmD Clinical Pharmacy Resident Pager: [phone]

## 2015-04-09 NOTE — Procedures (Signed)
RLE veno and mechanical thrombolysis Resolution of fem-pop DVT No complication No blood loss. See complete dictation in [facility].  Recommend continued anticoagulation.

## 2015-04-09 NOTE — Progress Notes (Signed)
ANTICOAGULATION CONSULT NOTE  Pharmacy Consult for Heparin Indication: pulmonary embolus and DVT for thrombolysis  Allergies  Allergen Reactions  . Amoxicillin     Childhood    Patient Measurements: Height:  (162.6 cm) Weight: 293 lb 10.4 oz (133.2 kg) IBW/kg (Calculated) : 54.7 Heparin Dosing Weight: 86.6 kg  Vital Signs: Temp: 98.2 F (36.8 C) (08/24 1112) Temp Source: Oral (08/24 1112) BP: 118/71 mmHg (08/24 1600) Pulse Rate: 111 (08/24 1700)  Labs:  Recent Labs  04/08/15 0808  04/08/15 1146  04/08/15 1848  04/08/15 2226 04/09/15 0200 04/09/15 0447 04/09/15 0954 04/09/15 1703 04/09/15 1704  HGB 10.1*  --   --   < >  --   --  9.9*  --  9.6* 9.6*  --   --   HCT 32.0*  --   --   < >  --   --  31.3*  --  30.2* 30.6*  --   --   PLT 278  --   --   < >  --   --  257  --  239 239  --   --   APTT  --   --  42*  --   --   < >  --  37  --  61* 69*  --   LABPROT  --   --  17.6*  --   --   --   --   --   --   --   --   --   INR  --   --  1.43  --   --   --   --   --   --   --   --   --   HEPARINUNFRC  --   < > >2.20*  --  1.84*  --   --  1.10*  --   --   --  0.92*  CREATININE 0.83  --   --   --   --   --   --   --  0.68  --   --   --   < > = values in this interval not displayed.  Estimated Creatinine Clearance: 141 mL/min (by C-G formula based on Cr of 0.68).  Medical History: Past Medical History  Diagnosis Date  . Obesity   . GERD (gastroesophageal reflux disease)   . Depression with anxiety    Medications:  Scheduled:  . buPROPion  150 mg Oral Daily  . fentaNYL      . lidocaine      . midazolam      . nitrofurantoin (macrocrystal-monohydrate)  100 mg Oral Q12H  . pantoprazole  40 mg Oral Daily  . sertraline  100 mg Oral Daily  . sodium chloride  3 mL Intravenous Q12H  . sodium chloride  3 mL Intravenous Q12H    Assessment: 29 yo Female with PE/DVT, EKOS [place] catheter in RT leg DVT, for heparin  APTT is within range at 69s. HL is still elevated    Goal of Therapy:  HL 0.3-0.7 per MD (aPTT 66-102 sec) Monitor platelets by anticoagulation protocol: Yes   Plan:  Continue heparin at 1750 units/hr Daily aPTT, HL, CBC Monitor heparin by aPTT until HL is within therapeutic range  Isaac Bliss, PharmD, BCPS Clinical Pharmacist Pager [phone] 04/09/2015 6:17 PM

## 2015-04-09 NOTE — Progress Notes (Signed)
Name: Christina Simmons MRN: [id number] DOB: July 26, 1986    ADMISSION DATE:  04/07/2015 CONSULTATION DATE:  8/23  REFERRING MD : [facility]  CHIEF COMPLAINT:  SOB  BRIEF PATIENT DESCRIPTION:  29yo obese female admitted by [facility] 8/23 with RLE swelling.  Noted to have extensive DVT and evidence of PE.  Tx to ICU for EKOS procedure/ catheter directed tPA.  PCCM asked to assume care in ICU.   SIGNIFICANT EVENTS    STUDIES:  8/23 IR>> EKOS for R Leg DVT    SUBJECTIVE:  No c/o.  Hungry.   VITAL SIGNS: Temp:  [98.1 F (36.7 C)-99.4 F (37.4 C)] 98.2 F (36.8 C) (08/24 1112) Pulse Rate:  [92-116] 106 (08/24 1500) Resp:  [13-28] 18 (08/24 1500) BP: (95-126)/(52-78) 120/64 mmHg (08/24 1500) SpO2:  [90 %-100 %] 96 % (08/24 1500)  PHYSICAL EXAMINATION: General:  MOWF NAD Neuro:  Intact HEENT:  No JVD/LAN, short neck Cardiovascular:  hsr rrr Lungs:  resps even non labored on RA, CTA Abdomen:  Obese + bs Musculoskeletal:  Intact Skin:  Rt lower ext edematous, hot, tender    Recent Labs Lab 04/08/15 0808 04/09/15 0447  NA 136 132*  K 3.9 3.8  CL 101 99*  CO2 25 24  BUN <5* 5*  CREATININE 0.83 0.68  GLUCOSE 105* 84    Recent Labs Lab 04/08/15 2226 04/09/15 0447 04/09/15 0954  HGB 9.9* 9.6* 9.6*  HCT 31.3* 30.2* 30.6*  WBC 12.5* 10.9* 9.7  PLT 257 239 239   Ir Veno/ext/uni Right  04/08/2015   CLINICAL DATA:  Acute right lower extremity symptomatic femoral popliteal DVT for 4 days. Associated acute pulmonary embolus. Right lower extremity remains symptomatic despite 4 days of anticoagulation.  EXAM: ULTRASOUND GUIDANCE FOR VENOUS ACCESS  RIGHT LOWER EXTREMITY VENOGRAM  INITIATION OF RIGHT LOWER EXTREMITY FEMORAL POPLITEAL DVT ULTRASOUND ASSISTED TRANSCATHETER THROMBOLYSIS  Date:  8/23/20168/23/2016 4:17 pm  Radiologist:  M. Ruel Favors, MD  Guidance:  ULTRASOUND AND FLUOROSCOPIC  FLUOROSCOPY TIME:  4 MINUTES 48 SECONDS, 763 MGY  MEDICATIONS AND MEDICAL HISTORY: 1 mg  Versed, 50 mcg fentanyl  ANESTHESIA/SEDATION: 30 minutes  CONTRAST:  60mL OMNIPAQUE IOHEXOL 300 MG/ML  SOLN  COMPLICATIONS: None immediate  PROCEDURE: Informed consent was obtained from the patient following explanation of the procedure, risks, benefits and alternatives. The patient understands, agrees and consents for the procedure. All questions were addressed. A time out was performed.  Maximal barrier sterile technique utilized including caps, mask, sterile gowns, sterile gloves, large sterile drape, hand hygiene, and ChloraPrep.  Previous imaging reviewed.  Patient positioned prone. Preliminary ultrasound performed. Occlusive thrombus present in the popliteal and femoral veins extending into the upper calf. Under sterile conditions and local anesthesia, ultrasound micropuncture access performed of the popliteal vein. Guidewire advanced easily under fluoroscopy and ultrasound. Four [place] dilator inserted. Bentson guidewire advanced. Six French sheath placed.  Five [place] Kumpe catheter advanced. Contrast injection performed for a a right lower extremity venogram.  Right lower extremity venogram: This demonstrates occlusive thrombus in the femoral popliteal veins extending to the common femoral vein. Catheter was advanced into the iliac vein. Iliac venogram confirms patency of the iliac veins and lower IVC.  Measurements obtained for the appropriate infusion catheter length.  Right lower extremity transcatheter thrombolysis: Over a Rosen exchange guidewire, an ultrasound assisted EKOS infusion catheter was advanced with a 40 cm infusion length to traverse the femoral popliteal DVT. Images obtained for documentation. Catheter secured externally.  Transcatheter ultrasound assisted DVT lysis  will begin at 1 milligram/hour. Follow-up venogram performed tomorrow after at least 18 hours of lysis.  Patient tolerated the procedure well. Findings discussed with the patient and family.  IMPRESSION: Successful ultrasound  and fluoroscopic right lower extremity venogram confirms occlusive acute right femoral popliteal DVT.  Successful insertion of an ultrasound assisted EKOS infusion catheter traversing the femoral popliteal DVT to begin transcatheter thrombolysis as above.   Electronically Signed   By: Judie Petit.  Shick M.D.   On: 04/08/2015 16:46   Ir Caffie Damme Ivc  04/09/2015   INDICATION: Right lower extremity occlusive femoral popliteal DVT, post overnight catheter directed ultrasound assisted thrombolytic infusion.  EXAM: 1. FOLLOW-UP RIGHT LOWER EXTREMITY VENOGRAPHY 2. MECHANICAL VENOUS THROMBECTOMY  COMPARISON:  previous day's exam  MEDICATIONS: Intravenous Fentanyl and Versed were administered as conscious sedation during continuous cardiorespiratory monitoring by the radiology RN, with a total moderate sedation time of 15 minutes.  CONTRAST:  60mL OMNIPAQUE IOHEXOL 300 MG/ML SOLN1mL OMNIPAQUE IOHEXOL 300 MG/ML SOLN72mL OMNIPAQUE IOHEXOL 300 MG/ML SOLN  FLUOROSCOPY TIME:  2 minutes 24 seconds, 347 mGy.  COMPLICATIONS: None immediate  TECHNIQUE: Patient placed prone. Right pelvic and lower extremity venography performed through the previously placed right popliteal venous sheath. The infusion catheter was removed. Sheath and surrounding skin prepped and draped in usual sterile fashion. Maximal barrier sterile technique was utilized including caps, mask, sterile gowns, sterile gloves, sterile drape, hand hygiene and skin antiseptic. Skin entry site of catheter infiltrated with 1% lidocaine. Catheter exchanged over a Bentson wire for an 8 [place] Angiojet catheter, a used for mechanical thrombolysis of residual femoral vein and popliteal vein partially occlusive DVT. The 6 French sheath was replaced and follow-up venography showed clearance of residual thrombus. Catheter removed and hemostasis achieved with manual compression.  The patient tolerated the procedure well without immediate postprocedural complication.  FINDINGS: Right  lower extremity and pelvic venography with catheter in place demonstrated residual partially occlusive thrombus in the proximal popliteal vein and distal femoral vein. The more central femoral vein thrombus had largely resolved. Iliac venous system remain widely patent.  After additional mechanical thrombectomy of the distal femoral vein and proximal popliteal vein thrombus, final venography shows no significant residual thrombus in the visualized femoral-popliteal system. Iliac venous system remains widely patent.  IMPRESSION: 1. Significant clearance of occlusive femoral popliteal thrombus after overnight catheter directed ultrasound assisted thrombolytic infusion.  2. Residual partially occlusive mural thrombus in the distal femoral and proximal popliteal veins responded well to Angiojet mechanical thrombectomy.  Recommend continued anticoagulation including weight based subcutaneous Lovenox for at least 30 days. Follow up U/S and IR clinic visit   [phone] in 2 wks.   Electronically Signed   By: Corlis Leak M.D.   On: 04/09/2015 15:17   Ir Thrombect Veno Mech Mod Sed  04/09/2015   INDICATION: Right lower extremity occlusive femoral popliteal DVT, post overnight catheter directed ultrasound assisted thrombolytic infusion.  EXAM: 1. FOLLOW-UP RIGHT LOWER EXTREMITY VENOGRAPHY 2. MECHANICAL VENOUS THROMBECTOMY  COMPARISON:  previous day's exam  MEDICATIONS: Intravenous Fentanyl and Versed were administered as conscious sedation during continuous cardiorespiratory monitoring by the radiology RN, with a total moderate sedation time of 15 minutes.  CONTRAST:  60mL OMNIPAQUE IOHEXOL 300 MG/ML SOLN44mL OMNIPAQUE IOHEXOL 300 MG/ML SOLN73mL OMNIPAQUE IOHEXOL 300 MG/ML SOLN  FLUOROSCOPY TIME:  2 minutes 24 seconds, 347 mGy.  COMPLICATIONS: None immediate  TECHNIQUE: Patient placed prone. Right pelvic and lower extremity venography performed through the previously placed right popliteal venous sheath. The infusion catheter  was removed. Sheath and surrounding skin prepped and draped in usual sterile fashion. Maximal barrier sterile technique was utilized including caps, mask, sterile gowns, sterile gloves, sterile drape, hand hygiene and skin antiseptic. Skin entry site of catheter infiltrated with 1% lidocaine. Catheter exchanged over a Bentson wire for an 8 [place] Angiojet catheter, a used for mechanical thrombolysis of residual femoral vein and popliteal vein partially occlusive DVT. The 6 French sheath was replaced and follow-up venography showed clearance of residual thrombus. Catheter removed and hemostasis achieved with manual compression.  The patient tolerated the procedure well without immediate postprocedural complication.  FINDINGS: Right lower extremity and pelvic venography with catheter in place demonstrated residual partially occlusive thrombus in the proximal popliteal vein and distal femoral vein. The more central femoral vein thrombus had largely resolved. Iliac venous system remain widely patent.  After additional mechanical thrombectomy of the distal femoral vein and proximal popliteal vein thrombus, final venography shows no significant residual thrombus in the visualized femoral-popliteal system. Iliac venous system remains widely patent.  IMPRESSION: 1. Significant clearance of occlusive femoral popliteal thrombus after overnight catheter directed ultrasound assisted thrombolytic infusion.  2. Residual partially occlusive mural thrombus in the distal femoral and proximal popliteal veins responded well to Angiojet mechanical thrombectomy.  Recommend continued anticoagulation including weight based subcutaneous Lovenox for at least 30 days. Follow up U/S and IR clinic visit   [phone] in 2 wks.   Electronically Signed   By: Corlis Leak M.D.   On: 04/09/2015 15:17   Ir US Guide Vasc Access Right  04/08/2015   CLINICAL DATA:  Acute right lower extremity symptomatic femoral popliteal DVT for 4 days. Associated  acute pulmonary embolus. Right lower extremity remains symptomatic despite 4 days of anticoagulation.  EXAM: ULTRASOUND GUIDANCE FOR VENOUS ACCESS  RIGHT LOWER EXTREMITY VENOGRAM  INITIATION OF RIGHT LOWER EXTREMITY FEMORAL POPLITEAL DVT ULTRASOUND ASSISTED TRANSCATHETER THROMBOLYSIS  Date:  8/23/20168/23/2016 4:17 pm  Radiologist:  M. Ruel Favors, MD  Guidance:  ULTRASOUND AND FLUOROSCOPIC  FLUOROSCOPY TIME:  4 MINUTES 48 SECONDS, 763 MGY  MEDICATIONS AND MEDICAL HISTORY: 1 mg Versed, 50 mcg fentanyl  ANESTHESIA/SEDATION: 30 minutes  CONTRAST:  60mL OMNIPAQUE IOHEXOL 300 MG/ML  SOLN  COMPLICATIONS: None immediate  PROCEDURE: Informed consent was obtained from the patient following explanation of the procedure, risks, benefits and alternatives. The patient understands, agrees and consents for the procedure. All questions were addressed. A time out was performed.  Maximal barrier sterile technique utilized including caps, mask, sterile gowns, sterile gloves, large sterile drape, hand hygiene, and ChloraPrep.  Previous imaging reviewed.  Patient positioned prone. Preliminary ultrasound performed. Occlusive thrombus present in the popliteal and femoral veins extending into the upper calf. Under sterile conditions and local anesthesia, ultrasound micropuncture access performed of the popliteal vein. Guidewire advanced easily under fluoroscopy and ultrasound. Four [place] dilator inserted. Bentson guidewire advanced. Six French sheath placed.  Five [place] Kumpe catheter advanced. Contrast injection performed for a a right lower extremity venogram.  Right lower extremity venogram: This demonstrates occlusive thrombus in the femoral popliteal veins extending to the common femoral vein. Catheter was advanced into the iliac vein. Iliac venogram confirms patency of the iliac veins and lower IVC.  Measurements obtained for the appropriate infusion catheter length.  Right lower extremity transcatheter thrombolysis: Over a Rosen  exchange guidewire, an ultrasound assisted EKOS infusion catheter was advanced with a 40 cm infusion length to traverse the femoral popliteal DVT. Images obtained for documentation. Catheter secured externally.  Transcatheter ultrasound  assisted DVT lysis will begin at 1 milligram/hour. Follow-up venogram performed tomorrow after at least 18 hours of lysis.  Patient tolerated the procedure well. Findings discussed with the patient and family.  IMPRESSION: Successful ultrasound and fluoroscopic right lower extremity venogram confirms occlusive acute right femoral popliteal DVT.  Successful insertion of an ultrasound assisted EKOS infusion catheter traversing the femoral popliteal DVT to begin transcatheter thrombolysis as above.   Electronically Signed   By: Judie Petit.  Shick M.D.   On: 04/08/2015 16:46   Ir Infusion Thrombol Venous Initial (ms)  04/08/2015   CLINICAL DATA:  Acute right lower extremity symptomatic femoral popliteal DVT for 4 days. Associated acute pulmonary embolus. Right lower extremity remains symptomatic despite 4 days of anticoagulation.  EXAM: ULTRASOUND GUIDANCE FOR VENOUS ACCESS  RIGHT LOWER EXTREMITY VENOGRAM  INITIATION OF RIGHT LOWER EXTREMITY FEMORAL POPLITEAL DVT ULTRASOUND ASSISTED TRANSCATHETER THROMBOLYSIS  Date:  8/23/20168/23/2016 4:17 pm  Radiologist:  M. Ruel Favors, MD  Guidance:  ULTRASOUND AND FLUOROSCOPIC  FLUOROSCOPY TIME:  4 MINUTES 48 SECONDS, 763 MGY  MEDICATIONS AND MEDICAL HISTORY: 1 mg Versed, 50 mcg fentanyl  ANESTHESIA/SEDATION: 30 minutes  CONTRAST:  60mL OMNIPAQUE IOHEXOL 300 MG/ML  SOLN  COMPLICATIONS: None immediate  PROCEDURE: Informed consent was obtained from the patient following explanation of the procedure, risks, benefits and alternatives. The patient understands, agrees and consents for the procedure. All questions were addressed. A time out was performed.  Maximal barrier sterile technique utilized including caps, mask, sterile gowns, sterile gloves, large  sterile drape, hand hygiene, and ChloraPrep.  Previous imaging reviewed.  Patient positioned prone. Preliminary ultrasound performed. Occlusive thrombus present in the popliteal and femoral veins extending into the upper calf. Under sterile conditions and local anesthesia, ultrasound micropuncture access performed of the popliteal vein. Guidewire advanced easily under fluoroscopy and ultrasound. Four [place] dilator inserted. Bentson guidewire advanced. Six French sheath placed.  Five [place] Kumpe catheter advanced. Contrast injection performed for a a right lower extremity venogram.  Right lower extremity venogram: This demonstrates occlusive thrombus in the femoral popliteal veins extending to the common femoral vein. Catheter was advanced into the iliac vein. Iliac venogram confirms patency of the iliac veins and lower IVC.  Measurements obtained for the appropriate infusion catheter length.  Right lower extremity transcatheter thrombolysis: Over a Rosen exchange guidewire, an ultrasound assisted EKOS infusion catheter was advanced with a 40 cm infusion length to traverse the femoral popliteal DVT. Images obtained for documentation. Catheter secured externally.  Transcatheter ultrasound assisted DVT lysis will begin at 1 milligram/hour. Follow-up venogram performed tomorrow after at least 18 hours of lysis.  Patient tolerated the procedure well. Findings discussed with the patient and family.  IMPRESSION: Successful ultrasound and fluoroscopic right lower extremity venogram confirms occlusive acute right femoral popliteal DVT.  Successful insertion of an ultrasound assisted EKOS infusion catheter traversing the femoral popliteal DVT to begin transcatheter thrombolysis as above.   Electronically Signed   By: Judie Petit.  Shick M.D.   On: 04/08/2015 16:46   Ir Rande Lawman F/u Eval Art/ven Final Day (ms)  04/09/2015   INDICATION: Right lower extremity occlusive femoral popliteal DVT, post overnight catheter directed ultrasound  assisted thrombolytic infusion.  EXAM: 1. FOLLOW-UP RIGHT LOWER EXTREMITY VENOGRAPHY 2. MECHANICAL VENOUS THROMBECTOMY  COMPARISON:  previous day's exam  MEDICATIONS: Intravenous Fentanyl and Versed were administered as conscious sedation during continuous cardiorespiratory monitoring by the radiology RN, with a total moderate sedation time of 15 minutes.  CONTRAST:  60mL OMNIPAQUE IOHEXOL 300 MG/ML SOLN19mL OMNIPAQUE  IOHEXOL 300 MG/ML SOLN49mL OMNIPAQUE IOHEXOL 300 MG/ML SOLN  FLUOROSCOPY TIME:  2 minutes 24 seconds, 347 mGy.  COMPLICATIONS: None immediate  TECHNIQUE: Patient placed prone. Right pelvic and lower extremity venography performed through the previously placed right popliteal venous sheath. The infusion catheter was removed. Sheath and surrounding skin prepped and draped in usual sterile fashion. Maximal barrier sterile technique was utilized including caps, mask, sterile gowns, sterile gloves, sterile drape, hand hygiene and skin antiseptic. Skin entry site of catheter infiltrated with 1% lidocaine. Catheter exchanged over a Bentson wire for an 8 [place] Angiojet catheter, a used for mechanical thrombolysis of residual femoral vein and popliteal vein partially occlusive DVT. The 6 French sheath was replaced and follow-up venography showed clearance of residual thrombus. Catheter removed and hemostasis achieved with manual compression.  The patient tolerated the procedure well without immediate postprocedural complication.  FINDINGS: Right lower extremity and pelvic venography with catheter in place demonstrated residual partially occlusive thrombus in the proximal popliteal vein and distal femoral vein. The more central femoral vein thrombus had largely resolved. Iliac venous system remain widely patent.  After additional mechanical thrombectomy of the distal femoral vein and proximal popliteal vein thrombus, final venography shows no significant residual thrombus in the visualized femoral-popliteal  system. Iliac venous system remains widely patent.  IMPRESSION: 1. Significant clearance of occlusive femoral popliteal thrombus after overnight catheter directed ultrasound assisted thrombolytic infusion.  2. Residual partially occlusive mural thrombus in the distal femoral and proximal popliteal veins responded well to Angiojet mechanical thrombectomy.  Recommend continued anticoagulation including weight based subcutaneous Lovenox for at least 30 days. Follow up U/S and IR clinic visit   [phone] in 2 wks.   Electronically Signed   By: Corlis Leak M.D.   On: 04/09/2015 15:17    Impression/Plan: Pulmonary embolism and infarction Extensive RLE DVT  Family hx VTE  Sinus tach - r/t PE.  Improved.   PLAN -  Cont EKOS/tPA per IR  F/u imaging  Continue Heparin gtt  Will need to discuss coumadin v newer oral anti-coagulant - likely needs lifelong anti-coagulation  F/u hypercoag panel (sent at [facility])  Supplemental O2 as needed  F/u 2D echo  [facility] for diet  Will need PT/OT  Cont bedrest x 24 hours post EKOS to RLE    Tx to tele.  [facility] to assume care 8/25.    Dirk Dress, NP 04/09/2015  4:46 PM Pager: [phone] or [phone]

## 2015-04-10 DIAGNOSIS — I2699 Other pulmonary embolism without acute cor pulmonale: Principal | ICD-10-CM

## 2015-04-10 DIAGNOSIS — I82401 Acute embolism and thrombosis of unspecified deep veins of right lower extremity: Secondary | ICD-10-CM

## 2015-04-10 LAB — COMPREHENSIVE METABOLIC PANEL
ALT: 16 U/L (ref 14–54)
ANION GAP: 7 (ref 5–15)
AST: 18 U/L (ref 15–41)
Albumin: 2 g/dL — ABNORMAL LOW (ref 3.5–5.0)
Alkaline Phosphatase: 94 U/L (ref 38–126)
BUN: <5 mg/dL — ABNORMAL LOW (ref 6–20)
CALCIUM: 8.3 mg/dL — AB (ref 8.9–10.3)
CHLORIDE: 102 mmol/L (ref 101–111)
CO2: 27 mmol/L (ref 22–32)
Creatinine, Ser: 0.63 mg/dL (ref 0.44–1.00)
Glucose, Bld: 104 mg/dL — ABNORMAL HIGH (ref 65–99)
Potassium: 3.8 mmol/L (ref 3.5–5.1)
SODIUM: 136 mmol/L (ref 135–145)
Total Bilirubin: 0.4 mg/dL (ref 0.3–1.2)
Total Protein: 6.3 g/dL — ABNORMAL LOW (ref 6.5–8.1)

## 2015-04-10 LAB — CBC
HCT: 29.9 % — ABNORMAL LOW (ref 36.0–46.0)
HEMOGLOBIN: 9.6 g/dL — AB (ref 12.0–15.0)
MCH: 27.6 pg (ref 26.0–34.0)
MCHC: 32.1 g/dL (ref 30.0–36.0)
MCV: 85.9 fL (ref 78.0–100.0)
Platelets: 244 10*3/uL (ref 150–400)
RBC: 3.48 MIL/uL — AB (ref 3.87–5.11)
RDW: 13.4 % (ref 11.5–15.5)
WBC: 9.4 10*3/uL (ref 4.0–10.5)

## 2015-04-10 LAB — APTT
APTT: 53 s — AB (ref 24–37)
aPTT: 59 seconds — ABNORMAL HIGH (ref 24–37)

## 2015-04-10 LAB — HEPARIN LEVEL (UNFRACTIONATED)
HEPARIN UNFRACTIONATED: 0.62 [IU]/mL (ref 0.30–0.70)
HEPARIN UNFRACTIONATED: 0.52 [IU]/mL (ref 0.30–0.70)

## 2015-04-10 MED ORDER — HEPARIN (PORCINE) IN NACL 100-0.45 UNIT/ML-% IJ SOLN
2650.0000 [IU]/h | INTRAMUSCULAR | Status: AC
  Administered 2015-04-11 (×2): 2250 [IU]/h via INTRAVENOUS
  Filled 2015-04-10 (×2): qty 250

## 2015-04-10 NOTE — Progress Notes (Signed)
Patient ID: Christina Simmons, female   DOB: [birth year], 29 y.o.   MRN: [id number]    Referring Physician(s): TRH  Chief Complaint:  RLE DVT  Subjective:  Pt doing ok today; states right leg feels better since completion of  venous lysis; still notes some "gurgling in chest"  Allergies: Amoxicillin  Medications: Prior to Admission medications   Medication Sig Start Date End Date Taking? Authorizing Provider  buPROPion (WELLBUTRIN XL) 150 MG 24 hr tablet Take 150 mg by mouth daily.   Yes Historical Provider, MD  sertraline (ZOLOFT) 100 MG tablet Take 100 mg by mouth daily.   Yes Historical Provider, MD     Vital Signs: BP 101/61 mmHg  Pulse 90  Temp(Src) 98.2 F (36.8 C) (Oral)  Resp 22  Ht 5\' 4"  (1.626 m)  Wt 293 lb 10.4 oz (133.2 kg)  BMI 50.38 kg/m2  SpO2 95%  LMP 03/18/2015  Physical Exam awake/alert; rt popliteal v access site clean and dry, minimal ecchymosis, mildly tender; intact distal pulses, 2+ edema  Imaging: Ir Veno/ext/uni Right  04/08/2015   CLINICAL DATA:  Acute right lower extremity symptomatic femoral popliteal DVT for 4 days. Associated acute pulmonary embolus. Right lower extremity remains symptomatic despite 4 days of anticoagulation.  EXAM: ULTRASOUND GUIDANCE FOR VENOUS ACCESS  RIGHT LOWER EXTREMITY VENOGRAM  INITIATION OF RIGHT LOWER EXTREMITY FEMORAL POPLITEAL DVT ULTRASOUND ASSISTED TRANSCATHETER THROMBOLYSIS  Date:  8/23/20168/23/2016 4:17 pm  Radiologist:  M. Ruel Favors, MD  Guidance:  ULTRASOUND AND FLUOROSCOPIC  FLUOROSCOPY TIME:  4 MINUTES 48 SECONDS, 763 MGY  MEDICATIONS AND MEDICAL HISTORY: 1 mg Versed, 50 mcg fentanyl  ANESTHESIA/SEDATION: 30 minutes  CONTRAST:  60mL OMNIPAQUE IOHEXOL 300 MG/ML  SOLN  COMPLICATIONS: None immediate  PROCEDURE: Informed consent was obtained from the patient following explanation of the procedure, risks, benefits and alternatives. The patient understands, agrees and consents for the procedure. All questions were  addressed. A time out was performed.  Maximal barrier sterile technique utilized including caps, mask, sterile gowns, sterile gloves, large sterile drape, hand hygiene, and ChloraPrep.  Previous imaging reviewed.  Patient positioned prone. Preliminary ultrasound performed. Occlusive thrombus present in the popliteal and femoral veins extending into the upper calf. Under sterile conditions and local anesthesia, ultrasound micropuncture access performed of the popliteal vein. Guidewire advanced easily under fluoroscopy and ultrasound. Four [place] dilator inserted. Bentson guidewire advanced. Six French sheath placed.  Five [place] Kumpe catheter advanced. Contrast injection performed for a a right lower extremity venogram.  Right lower extremity venogram: This demonstrates occlusive thrombus in the femoral popliteal veins extending to the common femoral vein. Catheter was advanced into the iliac vein. Iliac venogram confirms patency of the iliac veins and lower IVC.  Measurements obtained for the appropriate infusion catheter length.  Right lower extremity transcatheter thrombolysis: Over a Rosen exchange guidewire, an ultrasound assisted EKOS infusion catheter was advanced with a 40 cm infusion length to traverse the femoral popliteal DVT. Images obtained for documentation. Catheter secured externally.  Transcatheter ultrasound assisted DVT lysis will begin at 1 milligram/hour. Follow-up venogram performed tomorrow after at least 18 hours of lysis.  Patient tolerated the procedure well. Findings discussed with the patient and family.  IMPRESSION: Successful ultrasound and fluoroscopic right lower extremity venogram confirms occlusive acute right femoral popliteal DVT.  Successful insertion of an ultrasound assisted EKOS infusion catheter traversing the femoral popliteal DVT to begin transcatheter thrombolysis as above.   Electronically Signed   By: Judie Petit.  Shick M.D.   On: 04/08/2015  16:46   Ir Caffie Damme  Ivc  04/09/2015   INDICATION: Right lower extremity occlusive femoral popliteal DVT, post overnight catheter directed ultrasound assisted thrombolytic infusion.  EXAM: 1. FOLLOW-UP RIGHT LOWER EXTREMITY VENOGRAPHY 2. MECHANICAL VENOUS THROMBECTOMY  COMPARISON:  previous day's exam  MEDICATIONS: Intravenous Fentanyl and Versed were administered as conscious sedation during continuous cardiorespiratory monitoring by the radiology RN, with a total moderate sedation time of 15 minutes.  CONTRAST:  60mL OMNIPAQUE IOHEXOL 300 MG/ML SOLN31mL OMNIPAQUE IOHEXOL 300 MG/ML SOLN17mL OMNIPAQUE IOHEXOL 300 MG/ML SOLN  FLUOROSCOPY TIME:  2 minutes 24 seconds, 347 mGy.  COMPLICATIONS: None immediate  TECHNIQUE: Patient placed prone. Right pelvic and lower extremity venography performed through the previously placed right popliteal venous sheath. The infusion catheter was removed. Sheath and surrounding skin prepped and draped in usual sterile fashion. Maximal barrier sterile technique was utilized including caps, mask, sterile gowns, sterile gloves, sterile drape, hand hygiene and skin antiseptic. Skin entry site of catheter infiltrated with 1% lidocaine. Catheter exchanged over a Bentson wire for an 8 [place] Angiojet catheter, a used for mechanical thrombolysis of residual femoral vein and popliteal vein partially occlusive DVT. The 6 French sheath was replaced and follow-up venography showed clearance of residual thrombus. Catheter removed and hemostasis achieved with manual compression.  The patient tolerated the procedure well without immediate postprocedural complication.  FINDINGS: Right lower extremity and pelvic venography with catheter in place demonstrated residual partially occlusive thrombus in the proximal popliteal vein and distal femoral vein. The more central femoral vein thrombus had largely resolved. Iliac venous system remain widely patent.  After additional mechanical thrombectomy of the distal femoral vein and  proximal popliteal vein thrombus, final venography shows no significant residual thrombus in the visualized femoral-popliteal system. Iliac venous system remains widely patent.  IMPRESSION: 1. Significant clearance of occlusive femoral popliteal thrombus after overnight catheter directed ultrasound assisted thrombolytic infusion.  2. Residual partially occlusive mural thrombus in the distal femoral and proximal popliteal veins responded well to Angiojet mechanical thrombectomy.  Recommend continued anticoagulation including weight based subcutaneous Lovenox for at least 30 days. Follow up U/S and IR clinic visit   [phone] in 2 wks.   Electronically Signed   By: Corlis Leak M.D.   On: 04/09/2015 15:17   Ir Thrombect Veno Mech Mod Sed  04/09/2015   INDICATION: Right lower extremity occlusive femoral popliteal DVT, post overnight catheter directed ultrasound assisted thrombolytic infusion.  EXAM: 1. FOLLOW-UP RIGHT LOWER EXTREMITY VENOGRAPHY 2. MECHANICAL VENOUS THROMBECTOMY  COMPARISON:  previous day's exam  MEDICATIONS: Intravenous Fentanyl and Versed were administered as conscious sedation during continuous cardiorespiratory monitoring by the radiology RN, with a total moderate sedation time of 15 minutes.  CONTRAST:  60mL OMNIPAQUE IOHEXOL 300 MG/ML SOLN77mL OMNIPAQUE IOHEXOL 300 MG/ML SOLN47mL OMNIPAQUE IOHEXOL 300 MG/ML SOLN  FLUOROSCOPY TIME:  2 minutes 24 seconds, 347 mGy.  COMPLICATIONS: None immediate  TECHNIQUE: Patient placed prone. Right pelvic and lower extremity venography performed through the previously placed right popliteal venous sheath. The infusion catheter was removed. Sheath and surrounding skin prepped and draped in usual sterile fashion. Maximal barrier sterile technique was utilized including caps, mask, sterile gowns, sterile gloves, sterile drape, hand hygiene and skin antiseptic. Skin entry site of catheter infiltrated with 1% lidocaine. Catheter exchanged over a Bentson wire for an 8  [place] Angiojet catheter, a used for mechanical thrombolysis of residual femoral vein and popliteal vein partially occlusive DVT. The 6 French sheath was replaced and follow-up venography showed clearance of  residual thrombus. Catheter removed and hemostasis achieved with manual compression.  The patient tolerated the procedure well without immediate postprocedural complication.  FINDINGS: Right lower extremity and pelvic venography with catheter in place demonstrated residual partially occlusive thrombus in the proximal popliteal vein and distal femoral vein. The more central femoral vein thrombus had largely resolved. Iliac venous system remain widely patent.  After additional mechanical thrombectomy of the distal femoral vein and proximal popliteal vein thrombus, final venography shows no significant residual thrombus in the visualized femoral-popliteal system. Iliac venous system remains widely patent.  IMPRESSION: 1. Significant clearance of occlusive femoral popliteal thrombus after overnight catheter directed ultrasound assisted thrombolytic infusion.  2. Residual partially occlusive mural thrombus in the distal femoral and proximal popliteal veins responded well to Angiojet mechanical thrombectomy.  Recommend continued anticoagulation including weight based subcutaneous Lovenox for at least 30 days. Follow up U/S and IR clinic visit   [phone] in 2 wks.   Electronically Signed   By: Corlis Leak M.D.   On: 04/09/2015 15:17   Ir US Guide Vasc Access Right  04/08/2015   CLINICAL DATA:  Acute right lower extremity symptomatic femoral popliteal DVT for 4 days. Associated acute pulmonary embolus. Right lower extremity remains symptomatic despite 4 days of anticoagulation.  EXAM: ULTRASOUND GUIDANCE FOR VENOUS ACCESS  RIGHT LOWER EXTREMITY VENOGRAM  INITIATION OF RIGHT LOWER EXTREMITY FEMORAL POPLITEAL DVT ULTRASOUND ASSISTED TRANSCATHETER THROMBOLYSIS  Date:  8/23/20168/23/2016 4:17 pm  Radiologist:  M. Ruel Favors, MD  Guidance:  ULTRASOUND AND FLUOROSCOPIC  FLUOROSCOPY TIME:  4 MINUTES 48 SECONDS, 763 MGY  MEDICATIONS AND MEDICAL HISTORY: 1 mg Versed, 50 mcg fentanyl  ANESTHESIA/SEDATION: 30 minutes  CONTRAST:  60mL OMNIPAQUE IOHEXOL 300 MG/ML  SOLN  COMPLICATIONS: None immediate  PROCEDURE: Informed consent was obtained from the patient following explanation of the procedure, risks, benefits and alternatives. The patient understands, agrees and consents for the procedure. All questions were addressed. A time out was performed.  Maximal barrier sterile technique utilized including caps, mask, sterile gowns, sterile gloves, large sterile drape, hand hygiene, and ChloraPrep.  Previous imaging reviewed.  Patient positioned prone. Preliminary ultrasound performed. Occlusive thrombus present in the popliteal and femoral veins extending into the upper calf. Under sterile conditions and local anesthesia, ultrasound micropuncture access performed of the popliteal vein. Guidewire advanced easily under fluoroscopy and ultrasound. Four [place] dilator inserted. Bentson guidewire advanced. Six French sheath placed.  Five [place] Kumpe catheter advanced. Contrast injection performed for a a right lower extremity venogram.  Right lower extremity venogram: This demonstrates occlusive thrombus in the femoral popliteal veins extending to the common femoral vein. Catheter was advanced into the iliac vein. Iliac venogram confirms patency of the iliac veins and lower IVC.  Measurements obtained for the appropriate infusion catheter length.  Right lower extremity transcatheter thrombolysis: Over a Rosen exchange guidewire, an ultrasound assisted EKOS infusion catheter was advanced with a 40 cm infusion length to traverse the femoral popliteal DVT. Images obtained for documentation. Catheter secured externally.  Transcatheter ultrasound assisted DVT lysis will begin at 1 milligram/hour. Follow-up venogram performed tomorrow after at least 18  hours of lysis.  Patient tolerated the procedure well. Findings discussed with the patient and family.  IMPRESSION: Successful ultrasound and fluoroscopic right lower extremity venogram confirms occlusive acute right femoral popliteal DVT.  Successful insertion of an ultrasound assisted EKOS infusion catheter traversing the femoral popliteal DVT to begin transcatheter thrombolysis as above.   Electronically Signed   By: Judie Petit.  Shick M.D.  On: 04/08/2015 16:46   Ir Infusion Thrombol Venous Initial (ms)  04/08/2015   CLINICAL DATA:  Acute right lower extremity symptomatic femoral popliteal DVT for 4 days. Associated acute pulmonary embolus. Right lower extremity remains symptomatic despite 4 days of anticoagulation.  EXAM: ULTRASOUND GUIDANCE FOR VENOUS ACCESS  RIGHT LOWER EXTREMITY VENOGRAM  INITIATION OF RIGHT LOWER EXTREMITY FEMORAL POPLITEAL DVT ULTRASOUND ASSISTED TRANSCATHETER THROMBOLYSIS  Date:  8/23/20168/23/2016 4:17 pm  Radiologist:  M. Ruel Favors, MD  Guidance:  ULTRASOUND AND FLUOROSCOPIC  FLUOROSCOPY TIME:  4 MINUTES 48 SECONDS, 763 MGY  MEDICATIONS AND MEDICAL HISTORY: 1 mg Versed, 50 mcg fentanyl  ANESTHESIA/SEDATION: 30 minutes  CONTRAST:  60mL OMNIPAQUE IOHEXOL 300 MG/ML  SOLN  COMPLICATIONS: None immediate  PROCEDURE: Informed consent was obtained from the patient following explanation of the procedure, risks, benefits and alternatives. The patient understands, agrees and consents for the procedure. All questions were addressed. A time out was performed.  Maximal barrier sterile technique utilized including caps, mask, sterile gowns, sterile gloves, large sterile drape, hand hygiene, and ChloraPrep.  Previous imaging reviewed.  Patient positioned prone. Preliminary ultrasound performed. Occlusive thrombus present in the popliteal and femoral veins extending into the upper calf. Under sterile conditions and local anesthesia, ultrasound micropuncture access performed of the popliteal vein.  Guidewire advanced easily under fluoroscopy and ultrasound. Four [place] dilator inserted. Bentson guidewire advanced. Six French sheath placed.  Five [place] Kumpe catheter advanced. Contrast injection performed for a a right lower extremity venogram.  Right lower extremity venogram: This demonstrates occlusive thrombus in the femoral popliteal veins extending to the common femoral vein. Catheter was advanced into the iliac vein. Iliac venogram confirms patency of the iliac veins and lower IVC.  Measurements obtained for the appropriate infusion catheter length.  Right lower extremity transcatheter thrombolysis: Over a Rosen exchange guidewire, an ultrasound assisted EKOS infusion catheter was advanced with a 40 cm infusion length to traverse the femoral popliteal DVT. Images obtained for documentation. Catheter secured externally.  Transcatheter ultrasound assisted DVT lysis will begin at 1 milligram/hour. Follow-up venogram performed tomorrow after at least 18 hours of lysis.  Patient tolerated the procedure well. Findings discussed with the patient and family.  IMPRESSION: Successful ultrasound and fluoroscopic right lower extremity venogram confirms occlusive acute right femoral popliteal DVT.  Successful insertion of an ultrasound assisted EKOS infusion catheter traversing the femoral popliteal DVT to begin transcatheter thrombolysis as above.   Electronically Signed   By: Judie Petit.  Shick M.D.   On: 04/08/2015 16:46   Ir Rande Lawman F/u Eval Art/ven Final Day (ms)  04/09/2015   INDICATION: Right lower extremity occlusive femoral popliteal DVT, post overnight catheter directed ultrasound assisted thrombolytic infusion.  EXAM: 1. FOLLOW-UP RIGHT LOWER EXTREMITY VENOGRAPHY 2. MECHANICAL VENOUS THROMBECTOMY  COMPARISON:  previous day's exam  MEDICATIONS: Intravenous Fentanyl and Versed were administered as conscious sedation during continuous cardiorespiratory monitoring by the radiology RN, with a total moderate sedation  time of 15 minutes.  CONTRAST:  60mL OMNIPAQUE IOHEXOL 300 MG/ML SOLN56mL OMNIPAQUE IOHEXOL 300 MG/ML SOLN48mL OMNIPAQUE IOHEXOL 300 MG/ML SOLN  FLUOROSCOPY TIME:  2 minutes 24 seconds, 347 mGy.  COMPLICATIONS: None immediate  TECHNIQUE: Patient placed prone. Right pelvic and lower extremity venography performed through the previously placed right popliteal venous sheath. The infusion catheter was removed. Sheath and surrounding skin prepped and draped in usual sterile fashion. Maximal barrier sterile technique was utilized including caps, mask, sterile gowns, sterile gloves, sterile drape, hand hygiene and skin antiseptic. Skin entry site of  catheter infiltrated with 1% lidocaine. Catheter exchanged over a Bentson wire for an 8 [place] Angiojet catheter, a used for mechanical thrombolysis of residual femoral vein and popliteal vein partially occlusive DVT. The 6 French sheath was replaced and follow-up venography showed clearance of residual thrombus. Catheter removed and hemostasis achieved with manual compression.  The patient tolerated the procedure well without immediate postprocedural complication.  FINDINGS: Right lower extremity and pelvic venography with catheter in place demonstrated residual partially occlusive thrombus in the proximal popliteal vein and distal femoral vein. The more central femoral vein thrombus had largely resolved. Iliac venous system remain widely patent.  After additional mechanical thrombectomy of the distal femoral vein and proximal popliteal vein thrombus, final venography shows no significant residual thrombus in the visualized femoral-popliteal system. Iliac venous system remains widely patent.  IMPRESSION: 1. Significant clearance of occlusive femoral popliteal thrombus after overnight catheter directed ultrasound assisted thrombolytic infusion.  2. Residual partially occlusive mural thrombus in the distal femoral and proximal popliteal veins responded well to Angiojet  mechanical thrombectomy.  Recommend continued anticoagulation including weight based subcutaneous Lovenox for at least 30 days. Follow up U/S and IR clinic visit   [phone] in 2 wks.   Electronically Signed   By: Corlis Leak M.D.   On: 04/09/2015 15:17    Labs:  CBC:  Recent Labs  04/08/15 2226 04/09/15 0447 04/09/15 0954 04/10/15 0442  WBC 12.5* 10.9* 9.7 9.4  HGB 9.9* 9.6* 9.6* 9.6*  HCT 31.3* 30.2* 30.6* 29.9*  PLT 257 239 239 244    COAGS:  Recent Labs  04/08/15 1146  04/09/15 0200 04/09/15 0954 04/09/15 1703 04/10/15 0442  INR 1.43  --   --   --   --   --   APTT 42*  < > 37 61* 69* 59*  < > = values in this interval not displayed.  BMP:  Recent Labs  04/08/15 0808 04/09/15 0447 04/10/15 0442  NA 136 132* 136  K 3.9 3.8 3.8  CL 101 99* 102  CO2 GLUCOSE 105* 84 104*  BUN <5* 5* <5*  CALCIUM 8.8* 7.9* 8.3*  CREATININE 0.83 0.68 0.63  GFRNONAA >60 >60 >60  GFRAA >60 >60 >60    LIVER FUNCTION TESTS:  Recent Labs  04/08/15 0808 04/09/15 0447 04/10/15 0442  BILITOT 0.4 0.9 0.4  AST ALT ALKPHOS 84 73 94  PROT 7.1 6.2* 6.3*  ALBUMIN 2.3* 2.0* 2.0*    Assessment and Plan:  S/p thrombolysis via EKOS infusion system of RLE DVT, completed 8/24; creat ok, hgb/plts stable, recommend continued anticoagulation including weight based subcutaneous Lovenox for at least 30 days. Follow up U/S and IR clinic visit ([phone])  in 2 wks. Other plans as per CCM/TRH.   Signed: D. Jeananne Rama 04/10/2015, 1:32 PM   I spent a total of 15 minutes at the the patient's bedside AND on the patient's hospital floor or unit, greater than 50% of which was counseling/coordinating care for RLE DVT thrombolysis

## 2015-04-10 NOTE — Progress Notes (Signed)
Admission note:   Arrival Method: Via w/c from 67M. Mental Status: A&OX4. Telemetry: Placed on box #15, ST 115.  Skin: Intact. DSD behind right knee from EKOS procedure.  Ecchymotic areas on bilateral arms. Tubes: N/A. IV: LUE Heparin @ 21 mls/hr. RFA NSL. Pain: Denies. Family: At bedside. Living Situation: From home. Safety Measures: Call bell within reach.  6E Orientation: Oriented to unit and surroundings.  Leanna Battles, RN.

## 2015-04-10 NOTE — Progress Notes (Signed)
Patient transferred to 6 east room 15. Report called to Hortense Ramal, RN.  Husband at bedside.  Will monitor.

## 2015-04-10 NOTE — Progress Notes (Signed)
[place] TEAM 1 - Stepdown/ICU TEAM Progress Note  Christina Simmons ZOX:[id number] DOB: October 22, 1985 DOA: 04/07/2015 PCP: Gaye Alken, NP  Admit HPI / Brief Narrative: Christina Simmons is a 29 y.o.WF PMHx Depression, Anxiety, morbid obesity,   Transferred from [facility] for further management of pulmonary embolism and DVT. The patient states that since Wednesday last week, she started having right lower extremity pain, which got increasingly worse, then later was associated with pleuritic chest pain and dyspnea. 2 days ago, on Saturday, she went to [facility] and was diagnosed with the above-mentioned thromboembolic events.  She denies any recent traveling by plane or automobile, cigarette smoking, but she has 2 second-degree relatives with a history of thromboembolic events. She is also on hormonal-based birth control tablets. 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  When seen she was in no acute distress and denies any other new complaints.  HPI/Subjective: 8/25 A/O 4, NAD, negative CP, negative SOB, negative leg pain. States her father and father had multiple DVT/PE. Other risk factors patient is on OBC. Negative smoker   Assessment/Plan: Acute right femoral/popliteal DVT, , PE  -Successful EKOS right lower extremity -Hypercoagulability panel pending  -Continue Heparin gtt  -In a.m. discuss use of Coumadin vs NOAC in and a young professional -PT/OT consult placed      Code Status: FULL Family Communication: no family present at time of exam Disposition Plan: Once patient on anticoagulation    Consultants: Dr.Wesam Santa Genera [facility] M)  Procedure/Significant Events: 8/23 IR>> EKOS for right lower extremity venogram confirms occlusive acute right femoral popliteal DVT.    Culture   Antibiotics:   DVT prophylaxis: Heparin drip   Devices    LINES / TUBES:      Continuous Infusions: . heparin 2,100  Units/hr (04/10/15 1700)    Objective: VITAL SIGNS: Temp: 98.7 F (37.1 C) (08/25 1701) Temp Source: Oral (08/25 1701) BP: 128/78 mmHg (08/25 1701) Pulse Rate: 108 (08/25 1701) SPO2; FIO2:   Intake/Output Summary (Last 24 hours) at 04/10/15 1918 Last data filed at 04/10/15 1500  Gross per 24 hour  Intake 358.55 ml  Output   1300 ml  Net -941.45 ml     Exam: General: A/O 4, NAD, No acute respiratory distress Eyes: Negative headache, eye pain, double vision,negative scleral hemorrhage ENT: Negative Runny nose, negative ear pain, negative tinnitus, negative gingival bleeding, Neck:  Negative scars, masses, torticollis, lymphadenopathy, JVD Lungs: Clear to auscultation bilaterally without wheezes or crackles Cardiovascular: Regular rate and rhythm without murmur gallop or rub normal S1 and S2 Abdomen:negative abdominal pain, negative dysphagia, nondistended, positive soft, bowel sounds, no rebound, no ascites, no appreciable mass Extremities: No significant cyanosis, clubbing, or edema bilateral lower extremities, RLE pain to palpation calf area Psychiatric:  Negative depression, negative anxiety, negative fatigue, negative mania  Neurologic:  Cranial nerves II through XII intact, tongue/uvula midline, all extremities muscle strength 5/5, sensation intact throughout, negative dysarthria, negative expressive aphasia, negative receptive aphasia.    Data Reviewed: Basic Metabolic Panel:  Recent Labs Lab 04/08/15 0808 04/09/15 0447 04/10/15 0442  NA 136 132* 136  K 3.9 3.8 3.8  CL 101 99* 102  CO2 GLUCOSE 105* 84 104*  BUN <5* 5* <5*  CREATININE 0.83 0.68 0.63  CALCIUM 8.8* 7.9* 8.3*   Liver Function Tests:  Recent Labs Lab 04/08/15 0808 04/09/15 0447 04/10/15 0442  AST ALT 14 16 16  ALKPHOS 84 73 94  BILITOT 0.4 0.9 0.4  PROT 7.1 6.2* 6.3*  ALBUMIN 2.3* 2.0* 2.0*   No results for input(s): LIPASE, AMYLASE in the last 168 hours. No  results for input(s): AMMONIA in the last 168 hours. CBC:  Recent Labs Lab 04/08/15 1612 04/08/15 2226 04/09/15 0447 04/09/15 0954 04/10/15 0442  WBC 10.8* 12.5* 10.9* 9.7 9.4  HGB 9.8* 9.9* 9.6* 9.6* 9.6*  HCT 31.1* 31.3* 30.2* 30.6* 29.9*  MCV 85.4 85.5 85.3 85.7 85.9  PLT 254 257 239 239 244   Cardiac Enzymes: No results for input(s): CKTOTAL, CKMB, CKMBINDEX, TROPONINI in the last 168 hours. BNP (last 3 results) No results for input(s): BNP in the last 8760 hours.  ProBNP (last 3 results) No results for input(s): PROBNP in the last 8760 hours.  CBG:  Recent Labs Lab 04/08/15 1647  GLUCAP 90    Recent Results (from the past 240 hour(s))  MRSA PCR Screening     Status: None   Collection Time: 04/08/15 11:07 AM  Result Value Ref Range Status   MRSA by PCR NEGATIVE NEGATIVE Final    Comment:        The GeneXpert MRSA Assay (FDA approved for NASAL specimens only), is one component of a comprehensive MRSA colonization surveillance program. It is not intended to diagnose MRSA infection nor to guide or monitor treatment for MRSA infections.   Urine culture     Status: None   Collection Time: 04/08/15  9:15 PM  Result Value Ref Range Status   Specimen Description URINE, CLEAN CATCH  Final   Special Requests NONE  Final   Culture NO GROWTH 1 DAY  Final   Report Status 04/09/2015 FINAL  Final     Studies:  Recent x-ray studies have been reviewed in detail by the Attending Physician  Scheduled Meds:  Scheduled Meds: . buPROPion  150 mg Oral Daily  . nitrofurantoin (macrocrystal-monohydrate)  100 mg Oral Q12H  . pantoprazole  40 mg Oral Daily  . sertraline  100 mg Oral Daily  . sodium chloride  3 mL Intravenous Q12H  . sodium chloride  3 mL Intravenous Q12H    Time spent on care of this patient: 40 mins   Christina Simmons, Roselind Messier , MD  [facility] Office  [phone] Pager - [phone]  On-Call/Text Page:      [email or web address]      password  [email or web address]  If 7PM-7AM, please contact night-coverage [email or web address] Password [email or web address] 04/10/2015, 7:18 PM   LOS: 3 days   Care during the described time interval was provided by me .  I have reviewed this patient's available data, including medical history, events of note, physical examination, and all test results as part of my evaluation. I have personally reviewed and interpreted all radiology studies.   Carolyne Littles, MD [phone] Pager

## 2015-04-10 NOTE — Progress Notes (Addendum)
ANTICOAGULATION CONSULT NOTE  Pharmacy Consult for Heparin Indication: pulmonary embolus and DVT   Allergies  Allergen Reactions  . Amoxicillin     Childhood    Patient Measurements: Height:  (162.6 cm) Weight: 293 lb 10.4 oz (133.2 kg) IBW/kg (Calculated) : 54.7 Heparin Dosing Weight: 86.6 kg  Vital Signs: Temp: 98.2 F (36.8 C) (08/25 1122) Temp Source: Oral (08/25 1122) BP: 101/61 mmHg (08/25 0700) Pulse Rate: 90 (08/25 0700)  Labs:  Recent Labs  04/08/15 0808 04/08/15 1146  04/09/15 0200 04/09/15 0447 04/09/15 0954 04/09/15 1703 04/09/15 1704 04/10/15 0442 04/10/15 0443  HGB 10.1*  --   < >  --  9.6* 9.6*  --   --  9.6*  --   HCT 32.0*  --   < >  --  30.2* 30.6*  --   --  29.9*  --   PLT 278  --   < >  --  239 239  --   --  244  --   APTT  --  42*  < > 37  --  61* 69*  --  59*  --   LABPROT  --  17.6*  --   --   --   --   --   --   --   --   INR  --  1.43  --   --   --   --   --   --   --   --   HEPARINUNFRC  --  >2.20*  < > 1.10*  --   --   --  0.92*  --  0.62  CREATININE 0.83  --   --   --  0.68  --   --   --  0.63  --   < > = values in this interval not displayed.  Estimated Creatinine Clearance: 141 mL/min (by C-G formula based on Cr of 0.63).  Medical History: Past Medical History  Diagnosis Date  . Obesity   . GERD (gastroesophageal reflux disease)   . Depression with anxiety    Medications:  Scheduled:  . buPROPion  150 mg Oral Daily  . nitrofurantoin (macrocrystal-monohydrate)  100 mg Oral Q12H  . pantoprazole  40 mg Oral Daily  . sertraline  100 mg Oral Daily  . sodium chloride  3 mL Intravenous Q12H  . sodium chloride  3 mL Intravenous Q12H    Assessment: 29 yo Female with bilateral PE and extensive RLE DVT who underwent 18 hr EKOS [place] catheter on 8/23. Completed TPA on 8/24.  Today HL 0.62, aPTT 59 on 1750 units/hr.  Fibrinogen elevated at 689>463, Hgb 9.6, Plt wnl. No reports of bleeding    Goal of Therapy:  HL 0.3-0.7 (aPTT  66-102 sec) Monitor platelets by anticoagulation protocol: Yes   Plan:  Increase heparin to 1900 units/hr Repeat HL and aPTT at 1600 Daily HL, CBC  Arcola Jansky, PharmD Clinical Pharmacy Resident Pager: [phone]  04/10/2015 2:35 PM     =====================   Addendum: - aPTT 53, sub-therapeutic and trending down - HL 0.52, therapeutic, likely still affected by Eliquis - no interruption nor complications with infusion per RN - off alteplase   Plan: - Increase heparin gtt to 2100 units/hr - Check aPTT in 6 hrs    Briella Hobday D. Laney Potash, PharmD, BCPS Pager:  [phone] 04/10/2015, 4:51 PM

## 2015-04-10 NOTE — Progress Notes (Signed)
Patient wanting to get up OOB to chair.  Spoke with Dr Joseph Art of [facility].  Patient to remain on bedrest at this time.  Will continue to monitor.

## 2015-04-11 DIAGNOSIS — R0602 Shortness of breath: Secondary | ICD-10-CM

## 2015-04-11 LAB — COMPREHENSIVE METABOLIC PANEL
ALT: 25 U/L (ref 14–54)
AST: 28 U/L (ref 15–41)
Albumin: 2.3 g/dL — ABNORMAL LOW (ref 3.5–5.0)
Alkaline Phosphatase: 96 U/L (ref 38–126)
Anion gap: 10 (ref 5–15)
BUN: 8 mg/dL (ref 6–20)
CO2: 25 mmol/L (ref 22–32)
Calcium: 8.5 mg/dL — ABNORMAL LOW (ref 8.9–10.3)
Chloride: 99 mmol/L — ABNORMAL LOW (ref 101–111)
Creatinine, Ser: 0.65 mg/dL (ref 0.44–1.00)
Glucose, Bld: 94 mg/dL (ref 65–99)
POTASSIUM: 3.6 mmol/L (ref 3.5–5.1)
Sodium: 134 mmol/L — ABNORMAL LOW (ref 135–145)
TOTAL PROTEIN: 6.8 g/dL (ref 6.5–8.1)
Total Bilirubin: 0.3 mg/dL (ref 0.3–1.2)

## 2015-04-11 LAB — CBC
HEMATOCRIT: 31.7 % — AB (ref 36.0–46.0)
Hemoglobin: 10 g/dL — ABNORMAL LOW (ref 12.0–15.0)
MCH: 27.2 pg (ref 26.0–34.0)
MCHC: 31.5 g/dL (ref 30.0–36.0)
MCV: 86.4 fL (ref 78.0–100.0)
PLATELETS: 282 10*3/uL (ref 150–400)
RBC: 3.67 MIL/uL — AB (ref 3.87–5.11)
RDW: 13.6 % (ref 11.5–15.5)
WBC: 8.9 10*3/uL (ref 4.0–10.5)

## 2015-04-11 LAB — HEPARIN LEVEL (UNFRACTIONATED)
HEPARIN UNFRACTIONATED: 0.54 [IU]/mL (ref 0.30–0.70)
HEPARIN UNFRACTIONATED: 0.5 [IU]/mL (ref 0.30–0.70)

## 2015-04-11 LAB — APTT
APTT: 64 s — AB (ref 24–37)
APTT: 64 s — AB (ref 24–37)
aPTT: 65 seconds — ABNORMAL HIGH (ref 24–37)

## 2015-04-11 MED ORDER — ENOXAPARIN SODIUM 150 MG/ML ~~LOC~~ SOLN
1.0000 mg/kg | Freq: Two times a day (BID) | SUBCUTANEOUS | Status: DC
  Administered 2015-04-11 – 2015-04-12 (×2): 135 mg via SUBCUTANEOUS
  Filled 2015-04-11 (×4): qty 1

## 2015-04-11 MED ORDER — ENOXAPARIN SODIUM 150 MG/ML ~~LOC~~ SOLN: 1.0000 mg/kg | Freq: Two times a day (BID) | SUBCUTANEOUS | Status: DC

## 2015-04-11 MED ORDER — OXYCODONE HCL 5 MG PO TABS: 5.0000 mg | ORAL_TABLET | ORAL | Status: DC | PRN

## 2015-04-11 NOTE — Progress Notes (Signed)
ANTICOAGULATION CONSULT NOTE   Pharmacy Consult for Heparin Indication: pulmonary embolus and DVT   Allergies  Allergen Reactions  . Amoxicillin     Childhood    Patient Measurements: Height:  (162.6 cm) Weight: 293 lb 10.4 oz (133.2 kg) IBW/kg (Calculated) : 54.7 Heparin Dosing Weight: 86.6 kg  Vital Signs: Temp: 98.4 F (36.9 C) (08/25 2059) Temp Source: Oral (08/25 2059) BP: 119/61 mmHg (08/25 2059) Pulse Rate: 114 (08/25 2059)  Labs:  Recent Labs  04/08/15 1610 04/08/15 1146  04/09/15 0447 04/09/15 0954  04/09/15 1704 04/10/15 0442 04/10/15 0443 04/10/15 1617 04/10/15 2333  HGB 10.1*  --   < > 9.6* 9.6*  --   --  9.6*  --   --   --   HCT 32.0*  --   < > 30.2* 30.6*  --   --  29.9*  --   --   --   PLT 278  --   < > 239 239  --   --  244  --   --   --   APTT  --  42*  < >  --  61*  < >  --  59*  --  53* 64*  LABPROT  --  17.6*  --   --   --   --   --   --   --   --   --   INR  --  1.43  --   --   --   --   --   --   --   --   --   HEPARINUNFRC  --  >2.20*  < >  --   --   --  0.92*  --  0.62 0.52  --   CREATININE 0.83  --   --  0.68  --   --   --  0.63  --   --   --   < > = values in this interval not displayed.  Estimated Creatinine Clearance: 141 mL/min (by C-G formula based on Cr of 0.63).  Assessment: 29 yo Female with bilateral PE and extensive RLE DVT who underwent 18 hr EKOS [place] catheter on 8/23. Completed TPA on 8/24.  Using aPTT to monitor heparin as pt on Eliquis at [place]. aPTT 64 sec (slightly subtherapeutic). No issues with line or bleeding per RN. Likely aPTT and heparin level will be correlating soon.   Goal of Therapy:  HL 0.3-0.7 (aPTT 66-102 sec) Monitor platelets by anticoagulation protocol: Yes   Plan:  Increase heparin to 2250 units/hr Repeat HL and aPTT at 0630  Christoper Fabian, PharmD, BCPS Clinical pharmacist, pager [phone]  04/11/2015 12:24 AM

## 2015-04-11 NOTE — Evaluation (Signed)
Occupational Therapy Evaluation Patient Details Name: Christina Simmons MRN: [id number] DOB: 1986-05-14 Today's Date: 04/11/2015    History of Present Illness Patient is a 29 yo female admitted 04/07/15 with PE and RLE DVT.  S/P IR procedure for RLE DVT thrombolysis.  PMH:  Morbid obesity, depression, anxiety   Clinical Impression   Pt currently limited by endurance and pain in the RLE with weightbearing and mobility.  She has been educated on energy conservation strategies for selfcare tasks as well as AE/DME use.  No further acute or post acute OT needs.  Pt has all needed DME as well.     Follow Up Recommendations  No OT follow up;Supervision - Intermittent    Equipment Recommendations  None recommended by OT       Precautions / Restrictions Precautions Precautions: Fall Restrictions Weight Bearing Restrictions: No      Mobility Bed Mobility Overal bed mobility: Modified Independent             General bed mobility comments: Increased time and use of rail  Transfers Overall transfer level: Modified independent Equipment used: Rolling walker (2 wheeled) Transfers: Sit to/from Stand Sit to Stand: Modified independent (Device/Increase time)         General transfer comment: min erbal cues for hand placement to push up from the surface and not pull up from the walker.  .    Balance Overall balance assessment: Needs assistance   Sitting balance-Leahy Scale: Normal     Standing balance support: Bilateral upper extremity supported Standing balance-Leahy Scale: Fair Standing balance comment: Pt needs use of RW for support in standing.                             ADL Overall ADL's : Needs assistance/impaired Eating/Feeding: Independent   Grooming: Wash/dry hands;Wash/dry face;Modified independent;Sitting   Upper Body Bathing: Sitting;Modified independent   Lower Body Bathing: Minimal assistance;Sit to/from stand   Upper Body Dressing : Modified  independent;Sitting   Lower Body Dressing: Sit to/from stand;Supervision/safety   Toilet Transfer: Supervision/safety;Ambulation;Regular Toilet;Grab bars   Toileting- Clothing Manipulation and Hygiene: Supervision/safety;Sit to/from stand   Tub/ Shower Transfer: Walk-in shower;Supervision/safety;Ambulation;Rolling walker;3 in 1   Functional mobility during ADLs: Modified independent General ADL Comments: Pt limited with mobility distance secondary to RLE pain with weightbearing.  Able to compensate with use of the walker with modified independence.  Decreased ability to reach either foot if sitting EOB but can donn socks in long sitting.  Educated on availability of AE for LB selfcare as well as use of reacher for retrieving items dropped on the floor.  Do not feel she would need this for an extended period, just until pain decreases.  Pt has all needed DME.     Vision Vision Assessment?: No apparent visual deficits   Perception Perception Perception Tested?: No   Praxis Praxis Praxis tested?: Within functional limits    Pertinent Vitals/Pain Pain Assessment: 0-10 Pain Score: 8  Faces Pain Scale: Hurts whole lot Pain Location: RLE with sittting and mobility Pain Descriptors / Indicators: Penetrating Pain Intervention(s): Limited activity within patient's tolerance;Premedicated before session     Hand Dominance Left   Extremity/Trunk Assessment Upper Extremity Assessment Upper Extremity Assessment: Overall WFL for tasks assessed   Lower Extremity Assessment Lower Extremity Assessment: Defer to PT evaluation RLE Deficits / Details: Strength grossly 3+/5 - 4-/5;  Decreased ankle ROM - unable to DF to neutral RLE: Unable to fully assess  due to pain   Cervical / Trunk Assessment Cervical / Trunk Assessment: Normal   Communication Communication Communication: No difficulties   Cognition Arousal/Alertness: Awake/alert Behavior During Therapy: WFL for tasks  assessed/performed;Anxious Overall Cognitive Status: Within Functional Limits for tasks assessed                        Exercises Exercises: General Lower Extremity          Home Living Family/patient expects to be discharged to:: Private residence Living Arrangements: Spouse/significant other;Children (7 yo and 75 yo children) Available Help at Discharge: Family;Available 24 hours/day (Husband works evenings; Parents live next door) Type of Home: House Home Access: Level entry     Home Layout: One level     Bathroom Shower/Tub: Walk-in shower;Door   [employer] Toilet: Standard     Home Equipment: Gilmer Mor - single point;Walker - 2 wheels;Wheelchair - [employer];Shower seat          Prior Functioning/Environment Level of Independence: Independent        Comments: In school             OT Goals(Current goals can be found in the care plan section) Acute Rehab OT Goals Patient Stated Goal: To decrease pain  OT Frequency:                End of Session Equipment Utilized During Treatment: Rolling walker Nurse Communication: Mobility status;Other (comment) (IV needing attention)  Activity Tolerance: Patient limited by pain Patient left: in bed;with call bell/phone within reach   Time: 1416-1443 OT Time Calculation (min): 27 min Charges:  OT General Charges $OT Visit: 1 Procedure OT Evaluation $Initial OT Evaluation Tier I: 1 Procedure  Shellie Goettl OTR/L 04/11/2015, 2:56 PM

## 2015-04-11 NOTE — Progress Notes (Addendum)
ANTICOAGULATION CONSULT NOTE   Pharmacy Consult for Heparin Indication: pulmonary embolus and DVT   Allergies  Allergen Reactions  . Amoxicillin     Childhood    Patient Measurements: Height:  (162.6 cm) Weight: 293 lb 10.4 oz (133.2 kg) IBW/kg (Calculated) : 54.7 Heparin Dosing Weight: 86.6 kg  Vital Signs: Temp: 98 F (36.7 C) (08/26 0515) Temp Source: Oral (08/26 0515) BP: 125/73 mmHg (08/26 0515) Pulse Rate: 86 (08/26 0515)  Labs:  Recent Labs  04/08/15 1146  04/09/15 0447 04/09/15 0954  04/10/15 0442 04/10/15 0443 04/10/15 1617 04/10/15 2333 04/11/15 0614  HGB  --   < > 9.6* 9.6*  --  9.6*  --   --   --  10.0*  HCT  --   < > 30.2* 30.6*  --  29.9*  --   --   --  31.7*  PLT  --   < > 239 239  --  244  --   --   --  282  APTT 42*  < >  --  61*  < > 59*  --  53* 64* 65*  LABPROT 17.6*  --   --   --   --   --   --   --   --   --   INR 1.43  --   --   --   --   --   --   --   --   --   HEPARINUNFRC >2.20*  < >  --   --   < >  --  0.62 0.52  --  0.54  CREATININE  --   --  0.68  --   --  0.63  --   --   --   --   < > = values in this interval not displayed.  Estimated Creatinine Clearance: 141 mL/min (by C-G formula based on Cr of 0.63).  Assessment: 29 yo Female with bilateral PE and extensive RLE DVT who underwent 18 hr EKOS [place] catheter on 8/23. Completed TPA on 8/24.  Using aPTT to monitor heparin as pt on Eliquis at [place]. aPTT 64 sec (slightly subtherapeutic). No issues with line or bleeding per RN. Likely aPTT and heparin level will be correlating soon.  APTT remains slightly subtherapeutic at 65 sec on heparin 2250 units/hr. Nurse reports no issues with infusion or bleeding.   Goal of Therapy:  HL 0.3-0.7 (aPTT 66-102 sec) Monitor platelets by anticoagulation protocol: Yes   Plan:  Increase heparin to 2450 units/hr 6h HL and aPTT Daily HL Monitor s/sx of bleeding  Arlean Hopping. Newman Pies, PharmD Clinical Pharmacist Pager [phone]  04/11/2015 8:11  AM  ADDN: Repeat aPTT remains subtherapeutic at 64 seconds on heparin 2450 units/hr. Nurse reports no issues with infusion or bleeding.  Plan: Increase heparin to 2650 units/hr 6h HL and aPTT  Arlean Hopping. Newman Pies, PharmD Clinical Pharmacist Pager [phone]

## 2015-04-11 NOTE — Evaluation (Signed)
Physical Therapy Evaluation Patient Details Name: Christina Simmons MRN: [id number] DOB: 11-Apr-1986 Today's Date: 04/11/2015   History of Present Illness  Patient is a 29 yo female admitted 04/07/15 with PE and RLE DVT.  S/P IR procedure for RLE DVT thrombolysis.  PMH:  Morbid obesity, depression, anxiety  Clinical Impression  Patient presents with problems listed below.  Will benefit from acute PT to maximize functional independence prior to discharge home with family.  Do not anticipate any f/u PT needs at discharge.    Follow Up Recommendations No PT follow up;Supervision/Assistance - 24 hour    Equipment Recommendations  None recommended by PT    Recommendations for Other Services       Precautions / Restrictions Precautions Precautions: Fall Restrictions Weight Bearing Restrictions: No      Mobility  Bed Mobility Overal bed mobility: Modified Independent             General bed mobility comments: Increased time and use of rail  Transfers Overall transfer level: Needs assistance Equipment used: Rolling walker (2 wheeled) Transfers: Sit to/from Stand Sit to Stand: Min guard         General transfer comment: Verbal cues for hand placement.  Assist for safety only.  Stance on toes on Rt foot, unable to get Rt foot flat on floor due to pain.  UE support on RW for balance.  Ambulation/Gait Ambulation/Gait assistance: Min guard Ambulation Distance (Feet): 24 Feet Assistive device: Rolling walker (2 wheeled) Gait Pattern/deviations: Step-to pattern;Decreased stance time - right;Decreased step length - right;Decreased step length - left;Decreased dorsiflexion - right;Decreased weight shift to right;Antalgic Gait velocity: Decreased Gait velocity interpretation: Below normal speed for age/gender General Gait Details: Verbal cues for safe use of RW.  Patient with antalgic gait pattern.  Walking on toes on Rt foot - unable to get foot flat on floor due to  pain.  Stairs            Wheelchair Mobility    Modified Rankin (Stroke Patients Only)       Balance Overall balance assessment: Needs assistance         Standing balance support: Bilateral upper extremity supported Standing balance-Leahy Scale: Poor                               Pertinent Vitals/Pain Pain Assessment: 0-10 Pain Score: 8  Pain Location: RLE Pain Descriptors / Indicators: Aching;Sore Pain Intervention(s): Limited activity within patient's tolerance;Premedicated before session;Repositioned    Home Living Family/patient expects to be discharged to:: Private residence Living Arrangements: Spouse/significant other;Children (7 yo and 67 yo children) Available Help at Discharge: Family;Available 24 hours/day (Husband works evenings; Parents live next door) Type of Home: House Home Access: Level entry     Home Layout: One level Home Equipment: Cane - single point;Walker - 2 wheels;Wheelchair - [employer];Shower seat      Prior Function Level of Independence: Independent         Comments: In school     Hand Dominance        Extremity/Trunk Assessment   Upper Extremity Assessment: Generalized weakness           Lower Extremity Assessment: Generalized weakness;RLE deficits/detail RLE Deficits / Details: Strength grossly 3+/5 - 4-/5;  Decreased ankle ROM - unable to DF to neutral       Communication   Communication: No difficulties  Cognition Arousal/Alertness: Awake/alert Behavior During Therapy: [facility] for tasks  assessed/performed;Anxious Overall Cognitive Status: Within Functional Limits for tasks assessed                      General Comments      Exercises General Exercises - Lower Extremity Ankle Circles/Pumps: AROM;Both;10 reps;Supine      Assessment/Plan    PT Assessment Patient needs continued PT services  PT Diagnosis Difficulty walking;Abnormality of gait;Generalized weakness;Acute  pain   PT Problem List Decreased strength;Decreased activity tolerance;Decreased balance;Decreased mobility;Decreased knowledge of use of DME;Cardiopulmonary status limiting activity;Obesity;Pain  PT Treatment Interventions DME instruction;Gait training;Functional mobility training;Therapeutic activities;Therapeutic exercise;Patient/family education   PT Goals (Current goals can be found in the Care Plan section) Acute Rehab PT Goals Patient Stated Goal: To decrease pain PT Goal Formulation: With patient Time For Goal Achievement: 04/18/15 Potential to Achieve Goals: Good    Frequency Min 3X/week   Barriers to discharge        Co-evaluation               End of Session   Activity Tolerance: Patient limited by pain;Patient limited by fatigue Patient left: in bed;with call bell/phone within reach Nurse Communication: Mobility status         Time: 1610-9604 PT Time Calculation (min) (ACUTE ONLY): 23 min   Charges:   PT Evaluation $Initial PT Evaluation Tier I: 1 Procedure PT Treatments $Gait Training: 8-22 mins   PT G Codes:        Vena Austria 04-18-2015, 12:32 PM Durenda Hurt. Renaldo Fiddler, [facility] Acute Rehab Services Pager [phone]

## 2015-04-11 NOTE — Progress Notes (Signed)
ANTICOAGULATION CONSULT NOTE   Pharmacy Consult for change heparin to LMWH Indication: pulmonary embolus and DVT   Allergies  Allergen Reactions  . Amoxicillin     Childhood    Patient Measurements: Height:  (162.6 cm) Weight: 293 lb 10.4 oz (133.2 kg) IBW/kg (Calculated) : 54.7 Heparin Dosing Weight: 86.6 kg  Vital Signs: Temp: 98.7 F (37.1 C) (08/26 1710) Temp Source: Oral (08/26 1710) BP: 137/81 mmHg (08/26 1710) Pulse Rate: 107 (08/26 1710)  Labs:  Recent Labs  04/09/15 0447 04/09/15 0954  04/10/15 0442  04/10/15 1617 04/10/15 2333 04/11/15 0614 04/11/15 1450  HGB 9.6* 9.6*  --  9.6*  --   --   --  10.0*  --   HCT 30.2* 30.6*  --  29.9*  --   --   --  31.7*  --   PLT 239 239  --  244  --   --   --  282  --   APTT  --  61*  < > 59*  --  53* 64* 65* 64*  HEPARINUNFRC  --   --   < >  --   < > 0.52  --  0.54 0.50  CREATININE 0.68  --   --  0.63  --   --   --  0.65  --   < > = values in this interval not displayed.  Estimated Creatinine Clearance: 141 mL/min (by C-G formula based on Cr of 0.65).  Assessment: 29 yo Female with bilateral PE and extensive RLE DVT who underwent 18 hr EKOS [place] catheter on 8/23. Completed TPA on 8/24.   Currently on heparin drip. Pharmacy consulted to transition UFH to LMWH for at least 30 days per radiology's plans.  No bleeding reported.  Wt 133 kg, Creat OK.   Plan:  LMWH 1 mg/kg q12 = 135 q12 Monitor CBCq 72 hrs/bleeding issues  Herby Abraham, Pharm.D. [phone] 04/11/2015 6:32 PM

## 2015-04-11 NOTE — Discharge Summary (Addendum)
Physician Discharge Summary  Christina Simmons ZOX:[id number] DOB: 1986/01/27 DOA: 04/07/2015  PCP: Gaye Alken, NP  Admit date: 04/07/2015 Discharge date: 04/11/2015  Time spent: 40 minutes  Recommendations for Outpatient Follow-up:  Acute right femoral/popliteal DVT, , PE  -Successful EKOS right lower extremity -Hypercoagulability panel pending at [facility]  -Continue Heparin gtt  -Per the [facility] transition to weight-based Lovenox for minimum of 30 days -Follow-up with Dr. Gilmer Mor [facility] in [facility] weeks -Follow-up with Dr. Gaye Alken in one week for PE/DVT, hypercoagulability study findings -PT/OT consult placed     Discharge Diagnoses:  Principal Problem:   Pulmonary embolism and infarction Active Problems:   DVT of lower extremity (deep venous thrombosis)   Depression with anxiety   Morbid obesity   Dysuria   DVT (deep venous thrombosis)   SOB (shortness of breath)   Sinus tachycardia   Hypoxemia   Discharge Condition: Stable  Diet recommendation: Heart healthy  Filed Weights   04/07/15 2245 04/08/15 0135 04/08/15 1111  Weight: 129.275 kg (285 lb) 129.275 kg (285 lb) 133.2 kg (293 lb 10.4 oz)    History of present illness:  29 y.o.WF PMHx Depression, Anxiety, morbid obesity,   Transferred from [facility] for further management of pulmonary embolism and DVT. The patient states that since Wednesday last week, she started having right lower extremity pain, which got increasingly worse, then later was associated with pleuritic chest pain and dyspnea. 2 days ago, on Saturday, she went to [facility] and was diagnosed with the above-mentioned thromboembolic events.  She denies any recent traveling by plane or automobile, cigarette smoking, but she has 2 second-degree relatives with a history of thromboembolic events. She is also on hormonal-based birth control tablets. 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 During his hospitalization patient was treated with EKOS for right lower extremity venogram confirms occlusive acute right femoral popliteal DVT.    Consultants: Dr.Wesam Santa Genera [facility] M)  Procedure/Significant Events: 8/23 IR>> EKOS for right lower extremity venogram confirms occlusive acute right femoral popliteal DVT.   Discharge Exam: Filed Vitals:   04/11/15 0515 04/11/15 1000 04/11/15 1446 04/11/15 1710  BP: 125/73 123/67  137/81  Pulse: 86 85 105 107  Temp: 98 F (36.7 C) 98.4 F (36.9 C)  98.7 F (37.1 C)  TempSrc: Oral Oral  Oral  Resp: 17 20  20   Height:      Weight:      SpO2: 97% 98% 98% 97%    General: A/O 4, NAD, No acute respiratory distress Eyes: Negative headache, eye pain, double vision,negative scleral hemorrhage ENT: Negative Runny nose, negative ear pain, negative tinnitus, negative gingival bleeding, Neck: Negative scars, masses, torticollis, lymphadenopathy, JVD Lungs: Clear to auscultation bilaterally without wheezes or crackles Cardiovascular: Regular rate and rhythm without murmur gallop or rub normal S1 and S2 Abdomen:negative abdominal pain, negative dysphagia, nondistended, positive soft, bowel sounds, no rebound, no ascites, no appreciable mass Extremities: No significant cyanosis, clubbing, or edema bilateral lower extremities, RLE pain to palpation calf area   Discharge Instructions     Medication List    TAKE these medications        buPROPion 150 MG 24 hr tablet  Commonly known as:  WELLBUTRIN XL  Take 150 mg by mouth daily.     enoxaparin 150 MG/ML injection  Commonly known as:  LOVENOX  Inject 0.89 mLs (135 mg total) into the skin every  12 (twelve) hours.     oxyCODONE 5 MG immediate release tablet  Commonly known as:  Oxy IR/ROXICODONE  Take 1 tablet (5 mg total) by mouth every 3 (three) hours as needed for moderate pain, severe pain or breakthrough pain.     sertraline 100  MG tablet  Commonly known as:  ZOLOFT  Take 100 mg by mouth daily.        Allergies  Allergen Reactions  . Amoxicillin     Childhood       The results of significant diagnostics from this hospitalization (including imaging, microbiology, ancillary and laboratory) are listed below for reference.    Significant Diagnostic Studies: Ir Veno/ext/uni Right  04/08/2015   CLINICAL DATA:  Acute right lower extremity symptomatic femoral popliteal DVT for 4 days. Associated acute pulmonary embolus. Right lower extremity remains symptomatic despite 4 days of anticoagulation.  EXAM: ULTRASOUND GUIDANCE FOR VENOUS ACCESS  RIGHT LOWER EXTREMITY VENOGRAM  INITIATION OF RIGHT LOWER EXTREMITY FEMORAL POPLITEAL DVT ULTRASOUND ASSISTED TRANSCATHETER THROMBOLYSIS  Date:  8/23/20168/23/2016 4:17 pm  Radiologist:  M. Ruel Favors, MD  Guidance:  ULTRASOUND AND FLUOROSCOPIC  FLUOROSCOPY TIME:  4 MINUTES 48 SECONDS, 763 MGY  MEDICATIONS AND MEDICAL HISTORY: 1 mg Versed, 50 mcg fentanyl  ANESTHESIA/SEDATION: 30 minutes  CONTRAST:  60mL OMNIPAQUE IOHEXOL 300 MG/ML  SOLN  COMPLICATIONS: None immediate  PROCEDURE: Informed consent was obtained from the patient following explanation of the procedure, risks, benefits and alternatives. The patient understands, agrees and consents for the procedure. All questions were addressed. A time out was performed.  Maximal barrier sterile technique utilized including caps, mask, sterile gowns, sterile gloves, large sterile drape, hand hygiene, and ChloraPrep.  Previous imaging reviewed.  Patient positioned prone. Preliminary ultrasound performed. Occlusive thrombus present in the popliteal and femoral veins extending into the upper calf. Under sterile conditions and local anesthesia, ultrasound micropuncture access performed of the popliteal vein. Guidewire advanced easily under fluoroscopy and ultrasound. Four [place] dilator inserted. Bentson guidewire advanced. Six French sheath placed.   Five [place] Kumpe catheter advanced. Contrast injection performed for a a right lower extremity venogram.  Right lower extremity venogram: This demonstrates occlusive thrombus in the femoral popliteal veins extending to the common femoral vein. Catheter was advanced into the iliac vein. Iliac venogram confirms patency of the iliac veins and lower IVC.  Measurements obtained for the appropriate infusion catheter length.  Right lower extremity transcatheter thrombolysis: Over a Rosen exchange guidewire, an ultrasound assisted EKOS infusion catheter was advanced with a 40 cm infusion length to traverse the femoral popliteal DVT. Images obtained for documentation. Catheter secured externally.  Transcatheter ultrasound assisted DVT lysis will begin at 1 milligram/hour. Follow-up venogram performed tomorrow after at least 18 hours of lysis.  Patient tolerated the procedure well. Findings discussed with the patient and family.  IMPRESSION: Successful ultrasound and fluoroscopic right lower extremity venogram confirms occlusive acute right femoral popliteal DVT.  Successful insertion of an ultrasound assisted EKOS infusion catheter traversing the femoral popliteal DVT to begin transcatheter thrombolysis as above.   Electronically Signed   By: Judie Petit.  Shick M.D.   On: 04/08/2015 16:46   Ir Caffie Damme Ivc  04/09/2015   INDICATION: Right lower extremity occlusive femoral popliteal DVT, post overnight catheter directed ultrasound assisted thrombolytic infusion.  EXAM: 1. FOLLOW-UP RIGHT LOWER EXTREMITY VENOGRAPHY 2. MECHANICAL VENOUS THROMBECTOMY  COMPARISON:  previous day's exam  MEDICATIONS: Intravenous Fentanyl and Versed were administered as conscious sedation during continuous cardiorespiratory monitoring by the radiology RN, with  a total moderate sedation time of 15 minutes.  CONTRAST:  60mL OMNIPAQUE IOHEXOL 300 MG/ML SOLN32mL OMNIPAQUE IOHEXOL 300 MG/ML SOLN40mL OMNIPAQUE IOHEXOL 300 MG/ML SOLN  FLUOROSCOPY TIME:  2  minutes 24 seconds, 347 mGy.  COMPLICATIONS: None immediate  TECHNIQUE: Patient placed prone. Right pelvic and lower extremity venography performed through the previously placed right popliteal venous sheath. The infusion catheter was removed. Sheath and surrounding skin prepped and draped in usual sterile fashion. Maximal barrier sterile technique was utilized including caps, mask, sterile gowns, sterile gloves, sterile drape, hand hygiene and skin antiseptic. Skin entry site of catheter infiltrated with 1% lidocaine. Catheter exchanged over a Bentson wire for an 8 [place] Angiojet catheter, a used for mechanical thrombolysis of residual femoral vein and popliteal vein partially occlusive DVT. The 6 French sheath was replaced and follow-up venography showed clearance of residual thrombus. Catheter removed and hemostasis achieved with manual compression.  The patient tolerated the procedure well without immediate postprocedural complication.  FINDINGS: Right lower extremity and pelvic venography with catheter in place demonstrated residual partially occlusive thrombus in the proximal popliteal vein and distal femoral vein. The more central femoral vein thrombus had largely resolved. Iliac venous system remain widely patent.  After additional mechanical thrombectomy of the distal femoral vein and proximal popliteal vein thrombus, final venography shows no significant residual thrombus in the visualized femoral-popliteal system. Iliac venous system remains widely patent.  IMPRESSION: 1. Significant clearance of occlusive femoral popliteal thrombus after overnight catheter directed ultrasound assisted thrombolytic infusion.  2. Residual partially occlusive mural thrombus in the distal femoral and proximal popliteal veins responded well to Angiojet mechanical thrombectomy.  Recommend continued anticoagulation including weight based subcutaneous Lovenox for at least 30 days. Follow up U/S and IR clinic visit   [phone] in  2 wks.   Electronically Signed   By: Corlis Leak M.D.   On: 04/09/2015 15:17   Ir Thrombect Veno Mech Mod Sed  04/09/2015   INDICATION: Right lower extremity occlusive femoral popliteal DVT, post overnight catheter directed ultrasound assisted thrombolytic infusion.  EXAM: 1. FOLLOW-UP RIGHT LOWER EXTREMITY VENOGRAPHY 2. MECHANICAL VENOUS THROMBECTOMY  COMPARISON:  previous day's exam  MEDICATIONS: Intravenous Fentanyl and Versed were administered as conscious sedation during continuous cardiorespiratory monitoring by the radiology RN, with a total moderate sedation time of 15 minutes.  CONTRAST:  60mL OMNIPAQUE IOHEXOL 300 MG/ML SOLN66mL OMNIPAQUE IOHEXOL 300 MG/ML SOLN12mL OMNIPAQUE IOHEXOL 300 MG/ML SOLN  FLUOROSCOPY TIME:  2 minutes 24 seconds, 347 mGy.  COMPLICATIONS: None immediate  TECHNIQUE: Patient placed prone. Right pelvic and lower extremity venography performed through the previously placed right popliteal venous sheath. The infusion catheter was removed. Sheath and surrounding skin prepped and draped in usual sterile fashion. Maximal barrier sterile technique was utilized including caps, mask, sterile gowns, sterile gloves, sterile drape, hand hygiene and skin antiseptic. Skin entry site of catheter infiltrated with 1% lidocaine. Catheter exchanged over a Bentson wire for an 8 [place] Angiojet catheter, a used for mechanical thrombolysis of residual femoral vein and popliteal vein partially occlusive DVT. The 6 French sheath was replaced and follow-up venography showed clearance of residual thrombus. Catheter removed and hemostasis achieved with manual compression.  The patient tolerated the procedure well without immediate postprocedural complication.  FINDINGS: Right lower extremity and pelvic venography with catheter in place demonstrated residual partially occlusive thrombus in the proximal popliteal vein and distal femoral vein. The more central femoral vein thrombus had largely resolved. Iliac  venous system remain widely patent.  After  additional mechanical thrombectomy of the distal femoral vein and proximal popliteal vein thrombus, final venography shows no significant residual thrombus in the visualized femoral-popliteal system. Iliac venous system remains widely patent.  IMPRESSION: 1. Significant clearance of occlusive femoral popliteal thrombus after overnight catheter directed ultrasound assisted thrombolytic infusion.  2. Residual partially occlusive mural thrombus in the distal femoral and proximal popliteal veins responded well to Angiojet mechanical thrombectomy.  Recommend continued anticoagulation including weight based subcutaneous Lovenox for at least 30 days. Follow up U/S and IR clinic visit   [phone] in 2 wks.   Electronically Signed   By: Corlis Leak M.D.   On: 04/09/2015 15:17   Ir US Guide Vasc Access Right  04/08/2015   CLINICAL DATA:  Acute right lower extremity symptomatic femoral popliteal DVT for 4 days. Associated acute pulmonary embolus. Right lower extremity remains symptomatic despite 4 days of anticoagulation.  EXAM: ULTRASOUND GUIDANCE FOR VENOUS ACCESS  RIGHT LOWER EXTREMITY VENOGRAM  INITIATION OF RIGHT LOWER EXTREMITY FEMORAL POPLITEAL DVT ULTRASOUND ASSISTED TRANSCATHETER THROMBOLYSIS  Date:  8/23/20168/23/2016 4:17 pm  Radiologist:  M. Ruel Favors, MD  Guidance:  ULTRASOUND AND FLUOROSCOPIC  FLUOROSCOPY TIME:  4 MINUTES 48 SECONDS, 763 MGY  MEDICATIONS AND MEDICAL HISTORY: 1 mg Versed, 50 mcg fentanyl  ANESTHESIA/SEDATION: 30 minutes  CONTRAST:  60mL OMNIPAQUE IOHEXOL 300 MG/ML  SOLN  COMPLICATIONS: None immediate  PROCEDURE: Informed consent was obtained from the patient following explanation of the procedure, risks, benefits and alternatives. The patient understands, agrees and consents for the procedure. All questions were addressed. A time out was performed.  Maximal barrier sterile technique utilized including caps, mask, sterile gowns, sterile gloves, large  sterile drape, hand hygiene, and ChloraPrep.  Previous imaging reviewed.  Patient positioned prone. Preliminary ultrasound performed. Occlusive thrombus present in the popliteal and femoral veins extending into the upper calf. Under sterile conditions and local anesthesia, ultrasound micropuncture access performed of the popliteal vein. Guidewire advanced easily under fluoroscopy and ultrasound. Four [place] dilator inserted. Bentson guidewire advanced. Six French sheath placed.  Five [place] Kumpe catheter advanced. Contrast injection performed for a a right lower extremity venogram.  Right lower extremity venogram: This demonstrates occlusive thrombus in the femoral popliteal veins extending to the common femoral vein. Catheter was advanced into the iliac vein. Iliac venogram confirms patency of the iliac veins and lower IVC.  Measurements obtained for the appropriate infusion catheter length.  Right lower extremity transcatheter thrombolysis: Over a Rosen exchange guidewire, an ultrasound assisted EKOS infusion catheter was advanced with a 40 cm infusion length to traverse the femoral popliteal DVT. Images obtained for documentation. Catheter secured externally.  Transcatheter ultrasound assisted DVT lysis will begin at 1 milligram/hour. Follow-up venogram performed tomorrow after at least 18 hours of lysis.  Patient tolerated the procedure well. Findings discussed with the patient and family.  IMPRESSION: Successful ultrasound and fluoroscopic right lower extremity venogram confirms occlusive acute right femoral popliteal DVT.  Successful insertion of an ultrasound assisted EKOS infusion catheter traversing the femoral popliteal DVT to begin transcatheter thrombolysis as above.   Electronically Signed   By: Judie Petit.  Shick M.D.   On: 04/08/2015 16:46   Ir Infusion Thrombol Venous Initial (ms)  04/08/2015   CLINICAL DATA:  Acute right lower extremity symptomatic femoral popliteal DVT for 4 days. Associated acute  pulmonary embolus. Right lower extremity remains symptomatic despite 4 days of anticoagulation.  EXAM: ULTRASOUND GUIDANCE FOR VENOUS ACCESS  RIGHT LOWER EXTREMITY VENOGRAM  INITIATION OF RIGHT LOWER EXTREMITY FEMORAL  POPLITEAL DVT ULTRASOUND ASSISTED TRANSCATHETER THROMBOLYSIS  Date:  8/23/20168/23/2016 4:17 pm  Radiologist:  M. Ruel Favors, MD  Guidance:  ULTRASOUND AND FLUOROSCOPIC  FLUOROSCOPY TIME:  4 MINUTES 48 SECONDS, 763 MGY  MEDICATIONS AND MEDICAL HISTORY: 1 mg Versed, 50 mcg fentanyl  ANESTHESIA/SEDATION: 30 minutes  CONTRAST:  60mL OMNIPAQUE IOHEXOL 300 MG/ML  SOLN  COMPLICATIONS: None immediate  PROCEDURE: Informed consent was obtained from the patient following explanation of the procedure, risks, benefits and alternatives. The patient understands, agrees and consents for the procedure. All questions were addressed. A time out was performed.  Maximal barrier sterile technique utilized including caps, mask, sterile gowns, sterile gloves, large sterile drape, hand hygiene, and ChloraPrep.  Previous imaging reviewed.  Patient positioned prone. Preliminary ultrasound performed. Occlusive thrombus present in the popliteal and femoral veins extending into the upper calf. Under sterile conditions and local anesthesia, ultrasound micropuncture access performed of the popliteal vein. Guidewire advanced easily under fluoroscopy and ultrasound. Four [place] dilator inserted. Bentson guidewire advanced. Six French sheath placed.  Five [place] Kumpe catheter advanced. Contrast injection performed for a a right lower extremity venogram.  Right lower extremity venogram: This demonstrates occlusive thrombus in the femoral popliteal veins extending to the common femoral vein. Catheter was advanced into the iliac vein. Iliac venogram confirms patency of the iliac veins and lower IVC.  Measurements obtained for the appropriate infusion catheter length.  Right lower extremity transcatheter thrombolysis: Over a Rosen  exchange guidewire, an ultrasound assisted EKOS infusion catheter was advanced with a 40 cm infusion length to traverse the femoral popliteal DVT. Images obtained for documentation. Catheter secured externally.  Transcatheter ultrasound assisted DVT lysis will begin at 1 milligram/hour. Follow-up venogram performed tomorrow after at least 18 hours of lysis.  Patient tolerated the procedure well. Findings discussed with the patient and family.  IMPRESSION: Successful ultrasound and fluoroscopic right lower extremity venogram confirms occlusive acute right femoral popliteal DVT.  Successful insertion of an ultrasound assisted EKOS infusion catheter traversing the femoral popliteal DVT to begin transcatheter thrombolysis as above.   Electronically Signed   By: Judie Petit.  Shick M.D.   On: 04/08/2015 16:46   Ir Rande Lawman F/u Eval Art/ven Final Day (ms)  04/09/2015   INDICATION: Right lower extremity occlusive femoral popliteal DVT, post overnight catheter directed ultrasound assisted thrombolytic infusion.  EXAM: 1. FOLLOW-UP RIGHT LOWER EXTREMITY VENOGRAPHY 2. MECHANICAL VENOUS THROMBECTOMY  COMPARISON:  previous day's exam  MEDICATIONS: Intravenous Fentanyl and Versed were administered as conscious sedation during continuous cardiorespiratory monitoring by the radiology RN, with a total moderate sedation time of 15 minutes.  CONTRAST:  60mL OMNIPAQUE IOHEXOL 300 MG/ML SOLN12mL OMNIPAQUE IOHEXOL 300 MG/ML SOLN65mL OMNIPAQUE IOHEXOL 300 MG/ML SOLN  FLUOROSCOPY TIME:  2 minutes 24 seconds, 347 mGy.  COMPLICATIONS: None immediate  TECHNIQUE: Patient placed prone. Right pelvic and lower extremity venography performed through the previously placed right popliteal venous sheath. The infusion catheter was removed. Sheath and surrounding skin prepped and draped in usual sterile fashion. Maximal barrier sterile technique was utilized including caps, mask, sterile gowns, sterile gloves, sterile drape, hand hygiene and skin antiseptic.  Skin entry site of catheter infiltrated with 1% lidocaine. Catheter exchanged over a Bentson wire for an 8 [place] Angiojet catheter, a used for mechanical thrombolysis of residual femoral vein and popliteal vein partially occlusive DVT. The 6 French sheath was replaced and follow-up venography showed clearance of residual thrombus. Catheter removed and hemostasis achieved with manual compression.  The patient tolerated the procedure well without  immediate postprocedural complication.  FINDINGS: Right lower extremity and pelvic venography with catheter in place demonstrated residual partially occlusive thrombus in the proximal popliteal vein and distal femoral vein. The more central femoral vein thrombus had largely resolved. Iliac venous system remain widely patent.  After additional mechanical thrombectomy of the distal femoral vein and proximal popliteal vein thrombus, final venography shows no significant residual thrombus in the visualized femoral-popliteal system. Iliac venous system remains widely patent.  IMPRESSION: 1. Significant clearance of occlusive femoral popliteal thrombus after overnight catheter directed ultrasound assisted thrombolytic infusion.  2. Residual partially occlusive mural thrombus in the distal femoral and proximal popliteal veins responded well to Angiojet mechanical thrombectomy.  Recommend continued anticoagulation including weight based subcutaneous Lovenox for at least 30 days. Follow up U/S and IR clinic visit   [phone] in 2 wks.   Electronically Signed   By: Corlis Leak M.D.   On: 04/09/2015 15:17    Microbiology: Recent Results (from the past 240 hour(s))  MRSA PCR Screening     Status: None   Collection Time: 04/08/15 11:07 AM  Result Value Ref Range Status   MRSA by PCR NEGATIVE NEGATIVE Final    Comment:        The GeneXpert MRSA Assay (FDA approved for NASAL specimens only), is one component of a comprehensive MRSA colonization surveillance program. It is  not intended to diagnose MRSA infection nor to guide or monitor treatment for MRSA infections.   Urine culture     Status: None   Collection Time: 04/08/15  9:15 PM  Result Value Ref Range Status   Specimen Description URINE, CLEAN CATCH  Final   Special Requests NONE  Final   Culture NO GROWTH 1 DAY  Final   Report Status 04/09/2015 FINAL  Final     Labs: Basic Metabolic Panel:  Recent Labs Lab 04/08/15 0808 04/09/15 0447 04/10/15 0442 04/11/15 0614  NA 136 132* 136 134*  K 3.9 3.8 3.8 3.6  CL 101 99* 102 99*  CO2 GLUCOSE 105* 84 104* 94  BUN <5* 5* <5* 8  CREATININE 0.83 0.68 0.63 0.65  CALCIUM 8.8* 7.9* 8.3* 8.5*   Liver Function Tests:  Recent Labs Lab 04/08/15 0808 04/09/15 0447 04/10/15 0442 04/11/15 0614  AST ALT ALKPHOS 84 73 94 96  BILITOT 0.4 0.9 0.4 0.3  PROT 7.1 6.2* 6.3* 6.8  ALBUMIN 2.3* 2.0* 2.0* 2.3*   No results for input(s): LIPASE, AMYLASE in the last 168 hours. No results for input(s): AMMONIA in the last 168 hours. CBC:  Recent Labs Lab 04/08/15 2226 04/09/15 0447 04/09/15 0954 04/10/15 0442 04/11/15 0614  WBC 12.5* 10.9* 9.7 9.4 8.9  HGB 9.9* 9.6* 9.6* 9.6* 10.0*  HCT 31.3* 30.2* 30.6* 29.9* 31.7*  MCV 85.5 85.3 85.7 85.9 86.4  PLT 257 239 239 244 282   Cardiac Enzymes: No results for input(s): CKTOTAL, CKMB, CKMBINDEX, TROPONINI in the last 168 hours. BNP: BNP (last 3 results) No results for input(s): BNP in the last 8760 hours.  ProBNP (last 3 results) No results for input(s): PROBNP in the last 8760 hours.  CBG:  Recent Labs Lab 04/08/15 1647  GLUCAP 90       Signed:  Carolyne Littles, MD [facility] [phone] pager

## 2015-04-12 LAB — CBC
HEMATOCRIT: 32.5 % — AB (ref 36.0–46.0)
HEMOGLOBIN: 10 g/dL — AB (ref 12.0–15.0)
MCH: 26.5 pg (ref 26.0–34.0)
MCHC: 30.8 g/dL (ref 30.0–36.0)
MCV: 86.2 fL (ref 78.0–100.0)
Platelets: 337 10*3/uL (ref 150–400)
RBC: 3.77 MIL/uL — ABNORMAL LOW (ref 3.87–5.11)
RDW: 13.7 % (ref 11.5–15.5)
WBC: 7.5 10*3/uL (ref 4.0–10.5)

## 2015-04-12 MED ORDER — INFLUENZA VAC SPLIT QUAD 0.5 ML IM SUSY
0.5000 mL | PREFILLED_SYRINGE | INTRAMUSCULAR | Status: AC
  Administered 2015-04-12: 0.5 mL via INTRAMUSCULAR
  Filled 2015-04-12: qty 0.5

## 2015-04-12 MED ORDER — OXYCODONE HCL 5 MG PO TABS: 5.0000 mg | ORAL_TABLET | ORAL | Status: DC | PRN

## 2015-04-12 MED ORDER — ENOXAPARIN SODIUM 150 MG/ML ~~LOC~~ SOLN: 1.0000 mg/kg | Freq: Two times a day (BID) | SUBCUTANEOUS | Status: DC

## 2015-04-12 NOTE — Discharge Instructions (Signed)
°  Please ensure referral to physical therapy in an outpatient setting.  For Questions call Dr. Danie Binder, [phone] or cell phone [phone]   Embolectomy and Thrombectomy An embolectomy is the removal of an embolus. An embolus is usually a blood clot (or sometimes another small foreign object) that has traveled or moved from one place to another within the body. When a blood clot forms and stays in the same area where it is located it is called a thrombus. The removal of a thrombus is called a thrombectomy. LET YOUR CAREGIVER KNOW ABOUT:  Allergies to food or medicine.  Medicines taken, including vitamins, herbs, eyedrops, over-the-counter medicines, and creams.  Use of steroids (by mouth or creams).  Previous problems with anesthetics or numbing medicines.  History of bleeding problems, blood clots, vomiting of blood, or bloody bowel movements.  Previous surgery.  Breathing problems.  History of ulcers, including any treatment.  History of hemorrhoids.  Recent history of trauma.  Heart problems or history of chest pain.  Other health problems, especially diabetes and kidney problems.  Possibility of pregnancy, if this applies. RISKS AND COMPLICATIONS   Infection.  Bleeding.  Anesthetic side effects.  The embolus may break loose and travel to another area of the body during attempts to remove it.  Anytime parts of your body have been without blood for a long time, they may be damaged beyond repair. Sometimes, the tissues will swell.  A clot can reform in a vessel. This will require another procedure.  If you are elderly or in poor health, the risk of developing complications is greater. BEFORE THE PROCEDURE  Ask your health care provider if you need to arrive early before your procedure.  Ask your health care provider about changing or stopping your regular medicines. This is especially important if you are taking diabetes medicines or blood  thinners. PROCEDURE  This procedure is usually done with medicine that makes you sleep (general anesthetic). The location of the blood clot will help decide what type of anesthetic is used. The blood clot is located in a vessel, which can be reached with a thin tube (catheter). There are also other devices which may be used in certain circumstances. In any case, the artery involved is cut open. A catheter is placed in the artery, and the clot is either removed or destroyed. After making sure that the blood is flowing again, the cuts in the artery and skin are sewn up. AFTER THE PROCEDURE   You will be taken to a recovery room. You may be allowed to go home after several days or as your health care provider decides.  You may be placed on blood thinners.Watch for bleeding from the rectum, blood in your vomit, as well as bleeding from the site of the procedure. Document Released: 04/15/2006 Document Revised: 08/07/2013 Document Reviewed: 10/29/2010 [facility] Patient Information 2015 [place], [place]. This information is not intended to replace advice given to you by your health care provider. Make sure you discuss any questions you have with your health care provider.

## 2015-04-12 NOTE — Progress Notes (Signed)
Physical Therapy Treatment Patient Details Name: Christina Simmons MRN: [id number] DOB: [birth year] Today's Date: 04/12/2015    History of Present Illness Patient is a 29 yo female admitted 04/07/15 with PE and RLE DVT.  S/P IR procedure for RLE DVT thrombolysis.  PMH:  Morbid obesity, depression, anxiety    PT Comments    Patient continues to be limited by pain.  Able to ambulate 55' with RW, however pain at 10/10 and unable to put Rt foot flat on floor.  Would recommend f/u OP PT at discharge for continued therapy.   RN notified of increased pain level with mobility.   Follow Up Recommendations  Outpatient PT;Supervision/Assistance - 24 hour     Equipment Recommendations  None recommended by PT    Recommendations for Other Services       Precautions / Restrictions Precautions Precautions: Fall Restrictions Weight Bearing Restrictions: No    Mobility  Bed Mobility Overal bed mobility: Modified Independent             General bed mobility comments: Increased time.  Patient able to don socks this am.  Transfers Overall transfer level: Modified independent Equipment used: Rolling walker (2 wheeled) Transfers: Sit to/from Stand Sit to Stand: Modified independent (Device/Increase time)         General transfer comment: Using proper hand placement.  Increased time.  Ambulation/Gait Ambulation/Gait assistance: Min guard Ambulation Distance (Feet): 28 Feet Assistive device: Rolling walker (2 wheeled) Gait Pattern/deviations: Step-to pattern;Decreased stance time - right;Decreased step length - left;Decreased step length - right;Decreased stride length;Decreased weight shift to right;Antalgic Gait velocity: Decreased Gait velocity interpretation: Below normal speed for age/gender General Gait Details: Verbal cues for safe use of RW.  Patient with antalgic gait pattern.  Walking on toes on Rt foot - unable to get foot flat on floor due to pain.  Pain increasing to 10/10  with weight bearing.  RN notified.   Stairs            Wheelchair Mobility    Modified Rankin (Stroke Patients Only)       Balance           Standing balance support: Bilateral upper extremity supported Standing balance-Leahy Scale: Poor                      Cognition Arousal/Alertness: Awake/alert Behavior During Therapy: WFL for tasks assessed/performed;Anxious Overall Cognitive Status: Within Functional Limits for tasks assessed                      Exercises      General Comments        Pertinent Vitals/Pain Pain Assessment: 0-10 Pain Score: 10-Worst pain ever (3/10 in supine; increases to 10 with weight bearing) Pain Location: RLE with weight bearing Pain Descriptors / Indicators: Aching;Sharp;Sore Pain Intervention(s): Limited activity within patient's tolerance;Premedicated before session;Repositioned;Patient requesting pain meds-RN notified    Home Living                      Prior Function            PT Goals (current goals can now be found in the care plan section) Acute Rehab PT Goals Patient Stated Goal: To decrease pain Progress towards PT goals: Progressing toward goals    Frequency  Min 3X/week    PT Plan Discharge plan needs to be updated    Co-evaluation  End of Session   Activity Tolerance: Patient limited by pain;Patient limited by fatigue Patient left: in bed;with call bell/phone within reach;with family/visitor present     Time: 1610-9604 PT Time Calculation (min) (ACUTE ONLY): 12 min  Charges:  $Gait Training: 8-22 mins                    G Codes:      Vena Austria 04/22/2015, 11:04 AM Durenda Hurt. Renaldo Fiddler, [facility] Acute Rehab Services Pager [phone]

## 2015-04-12 NOTE — Progress Notes (Signed)
Pt seen at bedside. Stable for d/c once CBC is back and if Hg is stable.  Debbora Presto, MD  [facility] Pager [phone]  If 7PM-7AM, please contact night-coverage [email or web address] Password [email or web address]

## 2015-04-12 NOTE — Care Management Note (Signed)
/  Case Management Note  Patient Details  Name: Christina Simmons MRN: [id number] Date of Birth: [birth year]  Subjective/Objective:     Patient  Was admitted with an Acute right femoral/popliteal DVT, , PE                Action/Plan:   Recommendation for OP PT   Expected Discharge Date:       04/12/15           Expected Discharge Plan:  Home/Self Care  In-House Referral:  NA  Discharge planning Services   (Out patient PT)  Post Acute Care Choice:    Choice offered to:  Patient  DME Arranged:    DME Agency:  NA  HH Arranged:    [place] Agency:     Status of Service:  Completed, signed off  Medicare Important Message Given:    Date Medicare IM Given:    Medicare IM give by:    Date Additional Medicare IM Given:    Additional Medicare Important Message give by:     If discussed at [place] of Stay Meetings, dates discussed:    Additional Comments: CM met with patient regarding recommendation for OP PT, patient has Medicaid she lives in [place]. Patient instructed to call CM on Monday and will fax referral for OP PT to PT selected by patient. Patient is being discharged on lovenox explained that it is covered under Medicaid. Patient verbalized understanding. CM will follow up with patient on Monday 8/29.     Laurena Slimmer, RN 04/12/2015, 11:57 AM

## 2015-04-12 NOTE — Progress Notes (Addendum)
04/12/2015 12:00 PM  Christina Simmons to be D/C'd Home per MD order.  Discussed prescriptions and follow up appointments with the patient. Prescriptions given to patient, medication list explained in detail. Pt verbalized understanding.    Medication List    TAKE these medications        buPROPion 150 MG 24 hr tablet  Commonly known as:  WELLBUTRIN XL  Take 150 mg by mouth daily.     enoxaparin 150 MG/ML injection  Commonly known as:  LOVENOX  Inject 0.89 mLs (135 mg total) into the skin every 12 (twelve) hours.     oxyCODONE 5 MG immediate release tablet  Commonly known as:  Oxy IR/ROXICODONE  Take 1 tablet (5 mg total) by mouth every 3 (three) hours as needed for moderate pain, severe pain or breakthrough pain.     sertraline 100 MG tablet  Commonly known as:  ZOLOFT  Take 100 mg by mouth daily.        Filed Vitals:   04/12/15 0820  BP: 147/75  Pulse: 96  Temp: 98.8 F (37.1 C)  Resp: 18    Skin clean, dry and intact without evidence of skin break down, no evidence of skin tears noted. IV catheter discontinued intact. Site without signs and symptoms of complications. Dressing and pressure applied. Pt denies pain at this time. No complaints noted.   Note provided by Izola Price MD to return to work/school has also been provided to the patient.   An After Visit Summary was printed and given to the patient. Patient escorted via WC, and D/C home via private auto.  [employer], RN-BC, [employer] [facility] [address] Phone [phone]

## 2015-04-12 NOTE — Progress Notes (Signed)
04/12/2015 10:55 AM   Patient expressed some concerns about being discharged. PT was also at bedside with new recommendations for the patient as well. Will inform MD. Will continue to assess and monitor the patient.   [employer], RN-BC, [employer] [facility] [address] Phone [phone]

## 2015-04-14 ENCOUNTER — Other Ambulatory Visit: Payer: Self-pay | Admitting: Interventional Radiology

## 2015-04-14 ENCOUNTER — Other Ambulatory Visit (HOSPITAL_COMMUNITY): Payer: Self-pay | Admitting: Radiology

## 2015-04-14 ENCOUNTER — Other Ambulatory Visit: Payer: Self-pay | Admitting: Internal Medicine

## 2015-04-14 DIAGNOSIS — I82401 Acute embolism and thrombosis of unspecified deep veins of right lower extremity: Secondary | ICD-10-CM

## 2015-04-16 ENCOUNTER — Other Ambulatory Visit: Payer: Self-pay | Admitting: Interventional Radiology

## 2015-04-16 DIAGNOSIS — I82401 Acute embolism and thrombosis of unspecified deep veins of right lower extremity: Secondary | ICD-10-CM

## 2015-04-24 ENCOUNTER — Ambulatory Visit
Admission: RE | Admit: 2015-04-24 | Discharge: 2015-04-24 | Disposition: A | Discharge status: Home / Self Care | Payer: Medicaid Other | Source: Ambulatory Visit | Attending: Interventional Radiology | Admitting: Interventional Radiology

## 2015-04-24 ENCOUNTER — Other Ambulatory Visit: Payer: Medicaid Other

## 2015-04-24 ENCOUNTER — Other Ambulatory Visit (HOSPITAL_COMMUNITY): Payer: Self-pay | Admitting: Interventional Radiology

## 2015-04-24 DIAGNOSIS — I82401 Acute embolism and thrombosis of unspecified deep veins of right lower extremity: Secondary | ICD-10-CM

## 2015-04-24 HISTORY — DX: Other pulmonary embolism without acute cor pulmonale: I26.99

## 2015-04-24 HISTORY — DX: Acute embolism and thrombosis of unspecified deep veins of unspecified lower extremity: I82.409

## 2015-04-24 NOTE — Progress Notes (Signed)
Chief Complaint: Follow up right lower extremity catheter directed thrombolysis.   Referring Physician(s): Dr. Gonzella Lex.   History of Present Illness: Christina Simmons is a 29 y.o. female following up in interventional radiology clinic as a scheduled appointment after discharge home 04/11/2015 from [facility], status post catheter directed thrombolysis of right lower extremity DVT.  The patient was admitted 04/08/2015 with acute DVT of the right lower extremity as well as chest CT demonstrating pulmonary embolism. Interventional radiology evaluated the patient has an inpatient consult for catheter directed thrombolysis, which was initiated 04/08/2015 and completed 04/09/2015 to decrease the patient's symptoms and chance of developing postoperative buttock syndrome. The patient was discharged home 04/11/2015 with weight-based Lovenox dosing, with the intention of transitioning to oral anticoagulation after 30 days.  Upon evaluation today with duplex imaging of the right lower extremity, she has had rethrombosis of the right common femoral vein, femoral vein, profunda vein, and popliteal vein while on anticoagulation. She is again complaining of worsening symptoms of her right lower extremity with pain and swelling. These symptoms did seem to improve after her initial discharge from the hospital after treatment, and now have worsened again in the past few days. Presumably, she has experienced rethrombosis within the past few days.  She denies any syncopal events, shortness of breath, dyspnea on exertion. She does state that she has had some low intensity right-sided chest pain which she feels like is similar to the prior chest pain on her initial evaluation for pulmonary embolism.  Currently she is administering weight-based Lovenox 150 mg subcutaneous.  She reports that [facility] initiated thrombophilia workup on her initial admission in late August, and she does not have the  results of this workup at this point.   Past Medical History  Diagnosis Date  . Obesity   . GERD (gastroesophageal reflux disease)   . Depression with anxiety   . Bilateral pulmonary embolism   . DVT (deep venous thrombosis) August 2016    RLE    No past surgical history on file.  Allergies: Amoxicillin  Medications: Prior to Admission medications   Medication Sig Start Date End Date Taking? Authorizing Provider  buPROPion (WELLBUTRIN XL) 150 MG 24 hr tablet Take 150 mg by mouth daily.   Yes Historical Provider, MD  enoxaparin (LOVENOX) 150 MG/ML injection Inject 0.89 mLs (135 mg total) into the skin every 12 (twelve) hours. 04/12/15  Yes Dorothea Ogle, MD  sertraline (ZOLOFT) 100 MG tablet Take 100 mg by mouth daily.   Yes Historical Provider, MD  oxyCODONE (OXY IR/ROXICODONE) 5 MG immediate release tablet Take 1 tablet (5 mg total) by mouth every 3 (three) hours as needed for moderate pain, severe pain or breakthrough pain. Patient not taking: Reported on 04/24/2015 04/12/15   Dorothea Ogle, MD     No family history on file.  Social History   Social History  . Marital Status: Married    Spouse Name: N/A  . Number of Children: N/A  . Years of Education: N/A   Social History Main Topics  . Smoking status: Never Smoker   . Smokeless tobacco: Not on file  . Alcohol Use: No  . Drug Use: No  . Sexual Activity: Yes    Birth Control/ Protection: Pill   Other Topics Concern  . Not on file   Social History Narrative     Review of Systems: A 12 point ROS discussed and pertinent positives are indicated in the HPI above.  All other  systems are negative.  Review of Systems  Vital Signs: BP 137/73 mmHg  Pulse 84  Temp(Src) 98.2 F (36.8 C) (Oral)  Resp 14  SpO2 100%  LMP 04/07/2015 (Approximate)  Physical Exam  Mallampati Score:     Imaging: Ir Veno/ext/uni Right  04/08/2015   CLINICAL DATA:  Acute right lower extremity symptomatic femoral popliteal DVT for 4  days. Associated acute pulmonary embolus. Right lower extremity remains symptomatic despite 4 days of anticoagulation.  EXAM: ULTRASOUND GUIDANCE FOR VENOUS ACCESS  RIGHT LOWER EXTREMITY VENOGRAM  INITIATION OF RIGHT LOWER EXTREMITY FEMORAL POPLITEAL DVT ULTRASOUND ASSISTED TRANSCATHETER THROMBOLYSIS  Date:  8/23/20168/23/2016 4:17 pm  Radiologist:  M. Ruel Favors, MD  Guidance:  ULTRASOUND AND FLUOROSCOPIC  FLUOROSCOPY TIME:  4 MINUTES 48 SECONDS, 763 MGY  MEDICATIONS AND MEDICAL HISTORY: 1 mg Versed, 50 mcg fentanyl  ANESTHESIA/SEDATION: 30 minutes  CONTRAST:  60mL OMNIPAQUE IOHEXOL 300 MG/ML  SOLN  COMPLICATIONS: None immediate  PROCEDURE: Informed consent was obtained from the patient following explanation of the procedure, risks, benefits and alternatives. The patient understands, agrees and consents for the procedure. All questions were addressed. A time out was performed.  Maximal barrier sterile technique utilized including caps, mask, sterile gowns, sterile gloves, large sterile drape, hand hygiene, and ChloraPrep.  Previous imaging reviewed.  Patient positioned prone. Preliminary ultrasound performed. Occlusive thrombus present in the popliteal and femoral veins extending into the upper calf. Under sterile conditions and local anesthesia, ultrasound micropuncture access performed of the popliteal vein. Guidewire advanced easily under fluoroscopy and ultrasound. Four [place] dilator inserted. Bentson guidewire advanced. Six French sheath placed.  Five [place] Kumpe catheter advanced. Contrast injection performed for a a right lower extremity venogram.  Right lower extremity venogram: This demonstrates occlusive thrombus in the femoral popliteal veins extending to the common femoral vein. Catheter was advanced into the iliac vein. Iliac venogram confirms patency of the iliac veins and lower IVC.  Measurements obtained for the appropriate infusion catheter length.  Right lower extremity transcatheter  thrombolysis: Over a Rosen exchange guidewire, an ultrasound assisted EKOS infusion catheter was advanced with a 40 cm infusion length to traverse the femoral popliteal DVT. Images obtained for documentation. Catheter secured externally.  Transcatheter ultrasound assisted DVT lysis will begin at 1 milligram/hour. Follow-up venogram performed tomorrow after at least 18 hours of lysis.  Patient tolerated the procedure well. Findings discussed with the patient and family.  IMPRESSION: Successful ultrasound and fluoroscopic right lower extremity venogram confirms occlusive acute right femoral popliteal DVT.  Successful insertion of an ultrasound assisted EKOS infusion catheter traversing the femoral popliteal DVT to begin transcatheter thrombolysis as above.   Electronically Signed   By: Judie Petit.  Shick M.D.   On: 04/08/2015 16:46   Ir Caffie Damme Ivc  04/09/2015   INDICATION: Right lower extremity occlusive femoral popliteal DVT, post overnight catheter directed ultrasound assisted thrombolytic infusion.  EXAM: 1. FOLLOW-UP RIGHT LOWER EXTREMITY VENOGRAPHY 2. MECHANICAL VENOUS THROMBECTOMY  COMPARISON:  previous day's exam  MEDICATIONS: Intravenous Fentanyl and Versed were administered as conscious sedation during continuous cardiorespiratory monitoring by the radiology RN, with a total moderate sedation time of 15 minutes.  CONTRAST:  60mL OMNIPAQUE IOHEXOL 300 MG/ML SOLN42mL OMNIPAQUE IOHEXOL 300 MG/ML SOLN77mL OMNIPAQUE IOHEXOL 300 MG/ML SOLN  FLUOROSCOPY TIME:  2 minutes 24 seconds, 347 mGy.  COMPLICATIONS: None immediate  TECHNIQUE: Patient placed prone. Right pelvic and lower extremity venography performed through the previously placed right popliteal venous sheath. The infusion catheter was removed. Sheath and surrounding skin  prepped and draped in usual sterile fashion. Maximal barrier sterile technique was utilized including caps, mask, sterile gowns, sterile gloves, sterile drape, hand hygiene and skin  antiseptic. Skin entry site of catheter infiltrated with 1% lidocaine. Catheter exchanged over a Bentson wire for an 8 [place] Angiojet catheter, a used for mechanical thrombolysis of residual femoral vein and popliteal vein partially occlusive DVT. The 6 French sheath was replaced and follow-up venography showed clearance of residual thrombus. Catheter removed and hemostasis achieved with manual compression.  The patient tolerated the procedure well without immediate postprocedural complication.  FINDINGS: Right lower extremity and pelvic venography with catheter in place demonstrated residual partially occlusive thrombus in the proximal popliteal vein and distal femoral vein. The more central femoral vein thrombus had largely resolved. Iliac venous system remain widely patent.  After additional mechanical thrombectomy of the distal femoral vein and proximal popliteal vein thrombus, final venography shows no significant residual thrombus in the visualized femoral-popliteal system. Iliac venous system remains widely patent.  IMPRESSION: 1. Significant clearance of occlusive femoral popliteal thrombus after overnight catheter directed ultrasound assisted thrombolytic infusion.  2. Residual partially occlusive mural thrombus in the distal femoral and proximal popliteal veins responded well to Angiojet mechanical thrombectomy.  Recommend continued anticoagulation including weight based subcutaneous Lovenox for at least 30 days. Follow up U/S and IR clinic visit   [phone] in 2 wks.   Electronically Signed   By: Corlis Leak M.D.   On: 04/09/2015 15:17   US Venous Img Lower Unilateral Right  04/24/2015   CLINICAL DATA:  29 year old female with a history of right lower extremity DVT, diagnosed April 08, 2015. She underwent catheter directed thrombo lie cysts 04/08/2015 and 04/09/2015 completion.  She was discharged from the hospital on weight based Lovenox therapy and returns today for evaluation.  Initial symptoms  improved, and have again worsened in the right leg over the past few days.  EXAM: RIGHT LOWER EXTREMITY VENOUS DOPPLER ULTRASOUND  TECHNIQUE: Gray-scale sonography with graded compression, as well as color Doppler and duplex ultrasound were performed to evaluate the lower extremity deep venous systems from the level of the common femoral vein and including the common femoral, femoral, profunda femoral, popliteal and calf veins including the posterior tibial, peroneal and gastrocnemius veins when visible. The superficial great saphenous vein was also interrogated. Spectral Doppler was utilized to evaluate flow at rest and with distal augmentation maneuvers in the common femoral, femoral and popliteal veins.  COMPARISON:  Angiographic study 04/08/2015 and 04/09/2015  FINDINGS: Contralateral Common Femoral Vein: Respiratory phasicity is normal and symmetric with the symptomatic side. No evidence of thrombus. Normal compressibility.  Common Femoral Vein: Partial compressibility of the proximal right common femoral vein though flow is maintained. Respiratory phasicity is maintained of the proximal common femoral vein, as well as augmentation with Valsalva. There is clear evidence of thrombus within the distal common femoral vein extending through the femoral vein into the popliteal vein  Saphenofemoral Junction: No evidence of thrombus. Normal compressibility and flow on color Doppler imaging.  Profunda Femoral Vein: Occlusive thrombus involves the profunda vein.  Femoral Vein: Occlusive thrombus through the length of the femoral vein.  Popliteal Vein: Occlusive thrombus of the popliteal vein.  Superficial Great Saphenous Vein: No evidence of thrombus. Normal compressibility and flow on color Doppler imaging.  Other Findings:  None.  IMPRESSION: Right lower extremity duplex survey is positive for DVT involving the common femoral vein, femoral vein, profunda vein, and popliteal vein.  Signed,  Marijean Niemann  Kenna Gilbert, DO   [facility]  [facility]   Electronically Signed   By: Gilmer Mor D.O.   On: 04/24/2015 13:59   Ir Thrombect Veno Mech Mod Sed  04/09/2015   INDICATION: Right lower extremity occlusive femoral popliteal DVT, post overnight catheter directed ultrasound assisted thrombolytic infusion.  EXAM: 1. FOLLOW-UP RIGHT LOWER EXTREMITY VENOGRAPHY 2. MECHANICAL VENOUS THROMBECTOMY  COMPARISON:  previous day's exam  MEDICATIONS: Intravenous Fentanyl and Versed were administered as conscious sedation during continuous cardiorespiratory monitoring by the radiology RN, with a total moderate sedation time of 15 minutes.  CONTRAST:  60mL OMNIPAQUE IOHEXOL 300 MG/ML SOLN65mL OMNIPAQUE IOHEXOL 300 MG/ML SOLN52mL OMNIPAQUE IOHEXOL 300 MG/ML SOLN  FLUOROSCOPY TIME:  2 minutes 24 seconds, 347 mGy.  COMPLICATIONS: None immediate  TECHNIQUE: Patient placed prone. Right pelvic and lower extremity venography performed through the previously placed right popliteal venous sheath. The infusion catheter was removed. Sheath and surrounding skin prepped and draped in usual sterile fashion. Maximal barrier sterile technique was utilized including caps, mask, sterile gowns, sterile gloves, sterile drape, hand hygiene and skin antiseptic. Skin entry site of catheter infiltrated with 1% lidocaine. Catheter exchanged over a Bentson wire for an 8 [place] Angiojet catheter, a used for mechanical thrombolysis of residual femoral vein and popliteal vein partially occlusive DVT. The 6 French sheath was replaced and follow-up venography showed clearance of residual thrombus. Catheter removed and hemostasis achieved with manual compression.  The patient tolerated the procedure well without immediate postprocedural complication.  FINDINGS: Right lower extremity and pelvic venography with catheter in place demonstrated residual partially occlusive thrombus in the proximal popliteal vein and distal femoral vein.  The more central femoral vein thrombus had largely resolved. Iliac venous system remain widely patent.  After additional mechanical thrombectomy of the distal femoral vein and proximal popliteal vein thrombus, final venography shows no significant residual thrombus in the visualized femoral-popliteal system. Iliac venous system remains widely patent.  IMPRESSION: 1. Significant clearance of occlusive femoral popliteal thrombus after overnight catheter directed ultrasound assisted thrombolytic infusion.  2. Residual partially occlusive mural thrombus in the distal femoral and proximal popliteal veins responded well to Angiojet mechanical thrombectomy.  Recommend continued anticoagulation including weight based subcutaneous Lovenox for at least 30 days. Follow up U/S and IR clinic visit   [phone] in 2 wks.   Electronically Signed   By: Corlis Leak M.D.   On: 04/09/2015 15:17   Ir US Guide Vasc Access Right  04/08/2015   CLINICAL DATA:  Acute right lower extremity symptomatic femoral popliteal DVT for 4 days. Associated acute pulmonary embolus. Right lower extremity remains symptomatic despite 4 days of anticoagulation.  EXAM: ULTRASOUND GUIDANCE FOR VENOUS ACCESS  RIGHT LOWER EXTREMITY VENOGRAM  INITIATION OF RIGHT LOWER EXTREMITY FEMORAL POPLITEAL DVT ULTRASOUND ASSISTED TRANSCATHETER THROMBOLYSIS  Date:  8/23/20168/23/2016 4:17 pm  Radiologist:  M. Ruel Favors, MD  Guidance:  ULTRASOUND AND FLUOROSCOPIC  FLUOROSCOPY TIME:  4 MINUTES 48 SECONDS, 763 MGY  MEDICATIONS AND MEDICAL HISTORY: 1 mg Versed, 50 mcg fentanyl  ANESTHESIA/SEDATION: 30 minutes  CONTRAST:  60mL OMNIPAQUE IOHEXOL 300 MG/ML  SOLN  COMPLICATIONS: None immediate  PROCEDURE: Informed consent was obtained from the patient following explanation of the procedure, risks, benefits and alternatives. The patient understands, agrees and consents for the procedure. All questions were addressed. A time out was performed.  Maximal barrier sterile technique  utilized including caps, mask, sterile gowns, sterile gloves, large sterile drape, hand hygiene, and ChloraPrep.  Previous  imaging reviewed.  Patient positioned prone. Preliminary ultrasound performed. Occlusive thrombus present in the popliteal and femoral veins extending into the upper calf. Under sterile conditions and local anesthesia, ultrasound micropuncture access performed of the popliteal vein. Guidewire advanced easily under fluoroscopy and ultrasound. Four [place] dilator inserted. Bentson guidewire advanced. Six French sheath placed.  Five [place] Kumpe catheter advanced. Contrast injection performed for a a right lower extremity venogram.  Right lower extremity venogram: This demonstrates occlusive thrombus in the femoral popliteal veins extending to the common femoral vein. Catheter was advanced into the iliac vein. Iliac venogram confirms patency of the iliac veins and lower IVC.  Measurements obtained for the appropriate infusion catheter length.  Right lower extremity transcatheter thrombolysis: Over a Rosen exchange guidewire, an ultrasound assisted EKOS infusion catheter was advanced with a 40 cm infusion length to traverse the femoral popliteal DVT. Images obtained for documentation. Catheter secured externally.  Transcatheter ultrasound assisted DVT lysis will begin at 1 milligram/hour. Follow-up venogram performed tomorrow after at least 18 hours of lysis.  Patient tolerated the procedure well. Findings discussed with the patient and family.  IMPRESSION: Successful ultrasound and fluoroscopic right lower extremity venogram confirms occlusive acute right femoral popliteal DVT.  Successful insertion of an ultrasound assisted EKOS infusion catheter traversing the femoral popliteal DVT to begin transcatheter thrombolysis as above.   Electronically Signed   By: Judie Petit.  Shick M.D.   On: 04/08/2015 16:46   Ir Infusion Thrombol Venous Initial (ms)  04/08/2015   CLINICAL DATA:  Acute right lower extremity  symptomatic femoral popliteal DVT for 4 days. Associated acute pulmonary embolus. Right lower extremity remains symptomatic despite 4 days of anticoagulation.  EXAM: ULTRASOUND GUIDANCE FOR VENOUS ACCESS  RIGHT LOWER EXTREMITY VENOGRAM  INITIATION OF RIGHT LOWER EXTREMITY FEMORAL POPLITEAL DVT ULTRASOUND ASSISTED TRANSCATHETER THROMBOLYSIS  Date:  8/23/20168/23/2016 4:17 pm  Radiologist:  M. Ruel Favors, MD  Guidance:  ULTRASOUND AND FLUOROSCOPIC  FLUOROSCOPY TIME:  4 MINUTES 48 SECONDS, 763 MGY  MEDICATIONS AND MEDICAL HISTORY: 1 mg Versed, 50 mcg fentanyl  ANESTHESIA/SEDATION: 30 minutes  CONTRAST:  60mL OMNIPAQUE IOHEXOL 300 MG/ML  SOLN  COMPLICATIONS: None immediate  PROCEDURE: Informed consent was obtained from the patient following explanation of the procedure, risks, benefits and alternatives. The patient understands, agrees and consents for the procedure. All questions were addressed. A time out was performed.  Maximal barrier sterile technique utilized including caps, mask, sterile gowns, sterile gloves, large sterile drape, hand hygiene, and ChloraPrep.  Previous imaging reviewed.  Patient positioned prone. Preliminary ultrasound performed. Occlusive thrombus present in the popliteal and femoral veins extending into the upper calf. Under sterile conditions and local anesthesia, ultrasound micropuncture access performed of the popliteal vein. Guidewire advanced easily under fluoroscopy and ultrasound. Four [place] dilator inserted. Bentson guidewire advanced. Six French sheath placed.  Five [place] Kumpe catheter advanced. Contrast injection performed for a a right lower extremity venogram.  Right lower extremity venogram: This demonstrates occlusive thrombus in the femoral popliteal veins extending to the common femoral vein. Catheter was advanced into the iliac vein. Iliac venogram confirms patency of the iliac veins and lower IVC.  Measurements obtained for the appropriate infusion catheter length.  Right  lower extremity transcatheter thrombolysis: Over a Rosen exchange guidewire, an ultrasound assisted EKOS infusion catheter was advanced with a 40 cm infusion length to traverse the femoral popliteal DVT. Images obtained for documentation. Catheter secured externally.  Transcatheter ultrasound assisted DVT lysis will begin at 1 milligram/hour. Follow-up venogram performed tomorrow after  at least 18 hours of lysis.  Patient tolerated the procedure well. Findings discussed with the patient and family.  IMPRESSION: Successful ultrasound and fluoroscopic right lower extremity venogram confirms occlusive acute right femoral popliteal DVT.  Successful insertion of an ultrasound assisted EKOS infusion catheter traversing the femoral popliteal DVT to begin transcatheter thrombolysis as above.   Electronically Signed   By: Judie Petit.  Shick M.D.   On: 04/08/2015 16:46   Ir Rande Lawman F/u Eval Art/ven Final Day (ms)  04/09/2015   INDICATION: Right lower extremity occlusive femoral popliteal DVT, post overnight catheter directed ultrasound assisted thrombolytic infusion.  EXAM: 1. FOLLOW-UP RIGHT LOWER EXTREMITY VENOGRAPHY 2. MECHANICAL VENOUS THROMBECTOMY  COMPARISON:  previous day's exam  MEDICATIONS: Intravenous Fentanyl and Versed were administered as conscious sedation during continuous cardiorespiratory monitoring by the radiology RN, with a total moderate sedation time of 15 minutes.  CONTRAST:  60mL OMNIPAQUE IOHEXOL 300 MG/ML SOLN37mL OMNIPAQUE IOHEXOL 300 MG/ML SOLN42mL OMNIPAQUE IOHEXOL 300 MG/ML SOLN  FLUOROSCOPY TIME:  2 minutes 24 seconds, 347 mGy.  COMPLICATIONS: None immediate  TECHNIQUE: Patient placed prone. Right pelvic and lower extremity venography performed through the previously placed right popliteal venous sheath. The infusion catheter was removed. Sheath and surrounding skin prepped and draped in usual sterile fashion. Maximal barrier sterile technique was utilized including caps, mask, sterile gowns, sterile  gloves, sterile drape, hand hygiene and skin antiseptic. Skin entry site of catheter infiltrated with 1% lidocaine. Catheter exchanged over a Bentson wire for an 8 [place] Angiojet catheter, a used for mechanical thrombolysis of residual femoral vein and popliteal vein partially occlusive DVT. The 6 French sheath was replaced and follow-up venography showed clearance of residual thrombus. Catheter removed and hemostasis achieved with manual compression.  The patient tolerated the procedure well without immediate postprocedural complication.  FINDINGS: Right lower extremity and pelvic venography with catheter in place demonstrated residual partially occlusive thrombus in the proximal popliteal vein and distal femoral vein. The more central femoral vein thrombus had largely resolved. Iliac venous system remain widely patent.  After additional mechanical thrombectomy of the distal femoral vein and proximal popliteal vein thrombus, final venography shows no significant residual thrombus in the visualized femoral-popliteal system. Iliac venous system remains widely patent.  IMPRESSION: 1. Significant clearance of occlusive femoral popliteal thrombus after overnight catheter directed ultrasound assisted thrombolytic infusion.  2. Residual partially occlusive mural thrombus in the distal femoral and proximal popliteal veins responded well to Angiojet mechanical thrombectomy.  Recommend continued anticoagulation including weight based subcutaneous Lovenox for at least 30 days. Follow up U/S and IR clinic visit   [phone] in 2 wks.   Electronically Signed   By: Corlis Leak M.D.   On: 04/09/2015 15:17    Labs:  CBC:  Recent Labs  04/09/15 0954 04/10/15 0442 04/11/15 0614 04/12/15 0920  WBC 9.7 9.4 8.9 7.5  HGB 9.6* 9.6* 10.0* 10.0*  HCT 30.6* 29.9* 31.7* 32.5*  PLT 239 244 282 337    COAGS:  Recent Labs  04/08/15 1146  04/10/15 1617 04/10/15 2333 04/11/15 0614 04/11/15 1450  INR 1.43  --   --   --    --   --   APTT 42*  < > 53* 64* 65* 64*  < > = values in this interval not displayed.  BMP:  Recent Labs  04/08/15 0808 04/09/15 0447 04/10/15 0442 04/11/15 0614  NA 136 132* 136 134*  K 3.9 3.8 3.8 3.6  CL 101 99* 102 99*  CO2 25 24  27 25  GLUCOSE 105* 84 104* 94  BUN <5* 5* <5* 8  CALCIUM 8.8* 7.9* 8.3* 8.5*  CREATININE 0.83 0.68 0.63 0.65  GFRNONAA >60 >60 >60 >60  GFRAA >60 >60 >60 >60    LIVER FUNCTION TESTS:  Recent Labs  04/08/15 0808 04/09/15 0447 04/10/15 0442 04/11/15 0614  BILITOT 0.4 0.9 0.4 0.3  AST 29 23 18 28   ALT 14 16 16 25   ALKPHOS 84 73 94 96  PROT 7.1 6.2* 6.3* 6.8  ALBUMIN 2.3* 2.0* 2.0* 2.3*    TUMOR MARKERS: No results for input(s): AFPTM, CEA, CA199, CHROMGRNA in the last 8760 hours.  Assessment and Plan:  Ms. Dejoseph presents today for scheduled follow-up after catheter directed thrombolysis of the right lower extremity DVT, which was completed 04/09/2015. On her routine follow-up DVT study on today's date, there is evidence of rethrombosis throughout the length of the femoral-popliteal system. This thrombosis has occurred while receiving weight-based Lovenox therapeutic dosing.  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 We have scheduled her for elective IVC filter placement the morning of 04/25/2015 at [facility]. She will be rescheduling an appointment with her physician Amy Moon to continue her thrombophilia workup.  Secondarily, we will be scheduling her within the next week or so for catheter directed thrombolysis of the right lower extremity to decrease her chance of post-thrombotic syndrome. There is a chance that the vein will rethrombosis after treatment given her current status, however, I believe it is a low risk procedure with the benefit of decreasing her risk of  symptoms into the future.  I have discussed her care with her physician Amy Moon, and also discussed the IVC filter placement and catheter directed thrombolysis with Christina Simmons. She is in agreement with our plan of care.    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

## 2015-04-25 ENCOUNTER — Other Ambulatory Visit: Payer: Self-pay | Admitting: Radiology

## 2015-04-25 ENCOUNTER — Other Ambulatory Visit: Payer: Self-pay | Admitting: Physician Assistant

## 2015-04-28 ENCOUNTER — Inpatient Hospital Stay (HOSPITAL_COMMUNITY)
Admission: RE | Admit: 2015-04-28 | Discharge: 2015-04-30 | DRG: 253 | Disposition: A | Discharge status: Home / Self Care | Payer: Medicaid Other | Source: Ambulatory Visit | Attending: Interventional Radiology | Admitting: Interventional Radiology

## 2015-04-28 ENCOUNTER — Encounter (HOSPITAL_COMMUNITY): Payer: Self-pay

## 2015-04-28 VITALS — BP 126/62 | HR 78 | Temp 98.4°F | Resp 16 | Ht 64.0 in | Wt 282.0 lb

## 2015-04-28 DIAGNOSIS — I2699 Other pulmonary embolism without acute cor pulmonale: Secondary | ICD-10-CM

## 2015-04-28 DIAGNOSIS — Z6841 Body Mass Index (BMI) 40.0 and over, adult: Secondary | ICD-10-CM

## 2015-04-28 DIAGNOSIS — I82431 Acute embolism and thrombosis of right popliteal vein: Secondary | ICD-10-CM | POA: Diagnosis present

## 2015-04-28 DIAGNOSIS — I82411 Acute embolism and thrombosis of right femoral vein: Principal | ICD-10-CM | POA: Diagnosis present

## 2015-04-28 DIAGNOSIS — K219 Gastro-esophageal reflux disease without esophagitis: Secondary | ICD-10-CM | POA: Diagnosis present

## 2015-04-28 DIAGNOSIS — Z7901 Long term (current) use of anticoagulants: Secondary | ICD-10-CM

## 2015-04-28 DIAGNOSIS — I82409 Acute embolism and thrombosis of unspecified deep veins of unspecified lower extremity: Secondary | ICD-10-CM | POA: Diagnosis present

## 2015-04-28 DIAGNOSIS — F418 Other specified anxiety disorders: Secondary | ICD-10-CM | POA: Diagnosis present

## 2015-04-28 DIAGNOSIS — Z86711 Personal history of pulmonary embolism: Secondary | ICD-10-CM

## 2015-04-28 DIAGNOSIS — I82401 Acute embolism and thrombosis of unspecified deep veins of right lower extremity: Secondary | ICD-10-CM

## 2015-04-28 LAB — CBC WITH DIFFERENTIAL/PLATELET
Basophils Absolute: 0 10*3/uL (ref 0.0–0.1)
Basophils Relative: 0 % (ref 0–1)
EOS PCT: 8 % — AB (ref 0–5)
Eosinophils Absolute: 0.5 10*3/uL (ref 0.0–0.7)
HEMATOCRIT: 35.8 % — AB (ref 36.0–46.0)
Hemoglobin: 10.9 g/dL — ABNORMAL LOW (ref 12.0–15.0)
LYMPHS ABS: 2 10*3/uL (ref 0.7–4.0)
LYMPHS PCT: 28 % (ref 12–46)
MCH: 26.5 pg (ref 26.0–34.0)
MCHC: 30.4 g/dL (ref 30.0–36.0)
MCV: 86.9 fL (ref 78.0–100.0)
MONO ABS: 0.6 10*3/uL (ref 0.1–1.0)
MONOS PCT: 8 % (ref 3–12)
NEUTROS ABS: 4 10*3/uL (ref 1.7–7.7)
Neutrophils Relative %: 56 % (ref 43–77)
PLATELETS: 266 10*3/uL (ref 150–400)
RBC: 4.12 MIL/uL (ref 3.87–5.11)
RDW: 13.8 % (ref 11.5–15.5)
WBC: 7.2 10*3/uL (ref 4.0–10.5)

## 2015-04-28 LAB — ABO/RH: ABO/RH(D): B POS

## 2015-04-28 LAB — CBC
HCT: 36.5 % (ref 36.0–46.0)
Hemoglobin: 11.3 g/dL — ABNORMAL LOW (ref 12.0–15.0)
MCH: 26.7 pg (ref 26.0–34.0)
MCHC: 31 g/dL (ref 30.0–36.0)
MCV: 86.3 fL (ref 78.0–100.0)
PLATELETS: 240 10*3/uL (ref 150–400)
RBC: 4.23 MIL/uL (ref 3.87–5.11)
RDW: 13.7 % (ref 11.5–15.5)
WBC: 7.6 10*3/uL (ref 4.0–10.5)

## 2015-04-28 LAB — BASIC METABOLIC PANEL
ANION GAP: 8 (ref 5–15)
BUN: 13 mg/dL (ref 6–20)
CO2: 24 mmol/L (ref 22–32)
Calcium: 9 mg/dL (ref 8.9–10.3)
Chloride: 104 mmol/L (ref 101–111)
Creatinine, Ser: 0.81 mg/dL (ref 0.44–1.00)
GFR calc Af Amer: >60 mL/min (ref >60)
GLUCOSE: 94 mg/dL (ref 65–99)
POTASSIUM: 3.9 mmol/L (ref 3.5–5.1)
Sodium: 136 mmol/L (ref 135–145)

## 2015-04-28 LAB — FIBRINOGEN
FIBRINOGEN: 396 mg/dL (ref 204–475)
Fibrinogen: 374 mg/dL (ref 204–475)
Fibrinogen: 355 mg/dL (ref 204–475)

## 2015-04-28 LAB — HCG, SERUM, QUALITATIVE: Preg, Serum: NEGATIVE

## 2015-04-28 LAB — TYPE AND SCREEN
ABO/RH(D): B POS
ANTIBODY SCREEN: NEGATIVE

## 2015-04-28 LAB — MRSA PCR SCREENING: MRSA by PCR: NEGATIVE

## 2015-04-28 LAB — HEPARIN LEVEL (UNFRACTIONATED)
HEPARIN UNFRACTIONATED: 0.57 [IU]/mL (ref 0.30–0.70)
HEPARIN UNFRACTIONATED: 0.43 [IU]/mL (ref 0.30–0.70)
HEPARIN UNFRACTIONATED: 0.68 [IU]/mL (ref 0.30–0.70)

## 2015-04-28 LAB — PROTIME-INR
INR: 1.1 (ref 0.00–1.49)
PROTHROMBIN TIME: 14.4 s (ref 11.6–15.2)

## 2015-04-28 MED ORDER — FENTANYL CITRATE (PF) 100 MCG/2ML IJ SOLN
INTRAMUSCULAR | Status: AC
  Filled 2015-04-28: qty 4

## 2015-04-28 MED ORDER — SODIUM CHLORIDE 0.9 % IV SOLN: 250.0000 mL | INTRAVENOUS | Status: DC | PRN

## 2015-04-28 MED ORDER — HEPARIN (PORCINE) IN NACL 100-0.45 UNIT/ML-% IJ SOLN
950.0000 [IU]/h | INTRAMUSCULAR | Status: DC
  Administered 2015-04-28: 800 [IU]/h via INTRAVENOUS
  Administered 2015-04-29: 750 [IU]/h via INTRAVENOUS
  Filled 2015-04-28 (×2): qty 250

## 2015-04-28 MED ORDER — TENECTEPLASE 50 MG IV KIT
0.5000 mg/h | PACK | INTRAVENOUS | Status: DC
  Administered 2015-04-28 – 2015-04-29 (×4): 0.5 mg/h
  Filled 2015-04-28 (×5): qty 0.5

## 2015-04-28 MED ORDER — ONDANSETRON HCL 4 MG/2ML IJ SOLN
4.0000 mg | Freq: Four times a day (QID) | INTRAMUSCULAR | Status: DC | PRN
  Administered 2015-04-28: 4 mg via INTRAVENOUS
  Filled 2015-04-28: qty 2

## 2015-04-28 MED ORDER — TENECTEPLASE 50 MG IV KIT
0.5000 mg/h | PACK | INTRAVENOUS | Status: DC
  Filled 2015-04-28 (×2): qty 0.5

## 2015-04-28 MED ORDER — ACETAMINOPHEN 325 MG PO TABS
650.0000 mg | ORAL_TABLET | ORAL | Status: DC | PRN
  Administered 2015-04-28: 650 mg via ORAL
  Filled 2015-04-28: qty 2

## 2015-04-28 MED ORDER — FENTANYL CITRATE (PF) 100 MCG/2ML IJ SOLN
INTRAMUSCULAR | Status: AC | PRN
  Administered 2015-04-28: 25 ug via INTRAVENOUS
  Administered 2015-04-28: 50 ug via INTRAVENOUS

## 2015-04-28 MED ORDER — MORPHINE SULFATE (PF) 4 MG/ML IV SOLN
5.0000 mg | INTRAVENOUS | Status: DC | PRN
  Administered 2015-04-28 – 2015-04-29 (×3): 5 mg via INTRAVENOUS
  Filled 2015-04-28 (×3): qty 2

## 2015-04-28 MED ORDER — SODIUM CHLORIDE 0.9 % IV SOLN: INTRAVENOUS | Status: DC

## 2015-04-28 MED ORDER — MIDAZOLAM HCL 2 MG/2ML IJ SOLN: 1.0000 mg | INTRAMUSCULAR | Status: DC | PRN

## 2015-04-28 MED ORDER — MIDAZOLAM HCL 2 MG/2ML IJ SOLN
INTRAMUSCULAR | Status: AC | PRN
  Administered 2015-04-28: 1 mg via INTRAVENOUS
  Administered 2015-04-28: 0.5 mg via INTRAVENOUS

## 2015-04-28 MED ORDER — BUPROPION HCL ER (XL) 150 MG PO TB24
150.0000 mg | ORAL_TABLET | Freq: Every day | ORAL | Status: DC
  Administered 2015-04-28 – 2015-04-30 (×3): 150 mg via ORAL
  Filled 2015-04-28 (×3): qty 1

## 2015-04-28 MED ORDER — IOHEXOL 300 MG/ML  SOLN
150.0000 mL | Freq: Once | INTRAMUSCULAR | Status: DC | PRN
  Administered 2015-04-28: 60 mL via INTRAVENOUS
  Filled 2015-04-28: qty 150

## 2015-04-28 MED ORDER — SODIUM CHLORIDE 0.9 % IJ SOLN
3.0000 mL | Freq: Two times a day (BID) | INTRAMUSCULAR | Status: DC
  Administered 2015-04-28 – 2015-04-30 (×5): 3 mL via INTRAVENOUS

## 2015-04-28 MED ORDER — SODIUM CHLORIDE 0.9 % IV SOLN
INTRAVENOUS | Status: AC | PRN
  Administered 2015-04-28: 10 mL/h via INTRAVENOUS

## 2015-04-28 MED ORDER — HEPARIN SODIUM (PORCINE) 1000 UNIT/ML IJ SOLN
INTRAMUSCULAR | Status: AC
  Filled 2015-04-28: qty 1

## 2015-04-28 MED ORDER — SODIUM CHLORIDE 0.9 % IJ SOLN: 3.0000 mL | INTRAMUSCULAR | Status: DC | PRN

## 2015-04-28 MED ORDER — SERTRALINE HCL 100 MG PO TABS
100.0000 mg | ORAL_TABLET | Freq: Every day | ORAL | Status: DC
  Administered 2015-04-28 – 2015-04-30 (×3): 100 mg via ORAL
  Filled 2015-04-28 (×3): qty 1

## 2015-04-28 MED ORDER — MIDAZOLAM HCL 2 MG/2ML IJ SOLN
INTRAMUSCULAR | Status: AC
  Filled 2015-04-28: qty 4

## 2015-04-28 MED ORDER — LIDOCAINE HCL 1 % IJ SOLN
INTRAMUSCULAR | Status: AC
  Filled 2015-04-28: qty 20

## 2015-04-28 NOTE — Sedation Documentation (Signed)
Patient is resting comfortably. 

## 2015-04-28 NOTE — Progress Notes (Signed)
ANTICOAGULATION CONSULT NOTE - Initial Consult  Pharmacy Consult for heparin Indication: pulmonary embolus and DVT  Allergies  Allergen Reactions  . Amoxicillin     Childhood     Patient Measurements: Height:  (162.6 cm) Weight: 281 lb 15.5 oz (127.9 kg) IBW/kg (Calculated) : 54.7 Heparin Dosing Weight: 86.3 kg  Vital Signs: Temp: 98.9 F (37.2 C) (09/12 1145) Temp Source: Oral (09/12 1145) BP: 106/56 mmHg (09/12 1500) Pulse Rate: 83 (09/12 1700)  Labs:  Recent Labs  04/28/15 0716 04/28/15 1053 04/28/15 1800  HGB 10.9* 11.3*  --   HCT 35.8* 36.5  --   PLT 266 240  --   LABPROT 14.4  --   --   INR 1.10  --   --   HEPARINUNFRC  --  0.57 0.43  CREATININE 0.81  --   --     Estimated Creatinine Clearance: 135.9 mL/min (by C-G formula based on Cr of 0.81).   Medical History: Past Medical History  Diagnosis Date  . Obesity   . GERD (gastroesophageal reflux disease)   . Depression with anxiety   . Bilateral pulmonary embolism   . DVT (deep venous thrombosis) August 2016    RLE    Medications:  Prescriptions prior to admission  Medication Sig Dispense Refill Last Dose  . buPROPion (WELLBUTRIN XL) 150 MG 24 hr tablet Take 150 mg by mouth daily.   04/27/2015 at Unknown time  . enoxaparin (LOVENOX) 150 MG/ML injection Inject 0.89 mLs (135 mg total) into the skin every 12 (twelve) hours. 60 Syringe 0 04/27/2015 at 2100  . oxyCODONE (OXY IR/ROXICODONE) 5 MG immediate release tablet Take 1 tablet (5 mg total) by mouth every 3 (three) hours as needed for moderate pain, severe pain or breakthrough pain. 30 tablet 0 1-2 week ago at Unknown time  . sertraline (ZOLOFT) 100 MG tablet Take 100 mg by mouth daily.   04/27/2015 at 0830    Assessment: 29yo woman re-admitted 04/28/2015 for VTE. Previously admitted at end of last month for RLE pain later assoc w/ pleuritic CP and dyspnea. No recent travel, not a smoker, FH of thromboembolic events, and inactive lifestyle.  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 Pharmacy consulted to dose heparin.  PMH depression, anxiety, morbid obesity  9/8 Duplex positive for DVT of R common fem vein, fem vein, profunda vein, & popliteal vein. On enoxaparin 135 mg q12h subcut PTA, last dose evening of 9/11. Started on heparin 800 units/h, also on tenecteplase 0.5 mg/h. CBC stable.   Goal of Therapy:  Heparin level 0.2-0.5 units/ml Monitor platelets by anticoagulation protocol: Yes   Plan:  Continue heparin 800 units/h HL 6 hr Monitor HL, fibrinogen, and CBC while on heparin and tenecteplase Monitor s/sx bleeding   Hillery Aldo, Pharm.D. PGY2 Cardiology Pharmacy Resident Pager: [phone]  04/28/2015,6:54 PM    Addendum:   Initial HL 0.57 prior to heparin gtt initiation, likely d/t therapeutic effect of enoxaparin PTA. Continue heparin 800 units/h for now and decrease dose if 1700 HL elevated. Previously on heparin 2250 units/h during last admission with aPTT 65 & HL 0.54.   Hillery Aldo, [place].D. PGY2 Cardiology Pharmacy Resident Pager: [phone] 04/28/2015,6:54 PM   __________________________________________ Addendum:  F/U HL 0.43. Continue current dose of 800 units/hr. Next HL at 0000  Isaac Bliss, PharmD, [facility] Clinical Pharmacist Pager [phone] 04/28/2015 6:55 PM

## 2015-04-28 NOTE — Progress Notes (Addendum)
ANTICOAGULATION CONSULT NOTE - Initial Consult  Pharmacy Consult for heparin Indication: pulmonary embolus and DVT  Allergies  Allergen Reactions  . Amoxicillin     Childhood     Patient Measurements: Height:  (162.6 cm) Weight: 281 lb 15.5 oz (127.9 kg) IBW/kg (Calculated) : 54.7 Heparin Dosing Weight: 86.3 kg  Vital Signs: Temp: 98.1 F (36.7 C) (09/12 0634) BP: 114/59 mmHg (09/12 1215) Pulse Rate: 78 (09/12 1215)  Labs:  Recent Labs  04/28/15 0716  HGB 10.9*  HCT 35.8*  PLT 266  LABPROT 14.4  INR 1.10  CREATININE 0.81    Estimated Creatinine Clearance: 135.9 mL/min (by C-G formula based on Cr of 0.81).   Medical History: Past Medical History  Diagnosis Date  . Obesity   . GERD (gastroesophageal reflux disease)   . Depression with anxiety   . Bilateral pulmonary embolism   . DVT (deep venous thrombosis) August 2016    RLE    Medications:  Prescriptions prior to admission  Medication Sig Dispense Refill Last Dose  . buPROPion (WELLBUTRIN XL) 150 MG 24 hr tablet Take 150 mg by mouth daily.   04/27/2015 at Unknown time  . enoxaparin (LOVENOX) 150 MG/ML injection Inject 0.89 mLs (135 mg total) into the skin every 12 (twelve) hours. 60 Syringe 0 04/27/2015 at 2100  . oxyCODONE (OXY IR/ROXICODONE) 5 MG immediate release tablet Take 1 tablet (5 mg total) by mouth every 3 (three) hours as needed for moderate pain, severe pain or breakthrough pain. 30 tablet 0 Past Month at Unknown time  . sertraline (ZOLOFT) 100 MG tablet Take 100 mg by mouth daily.   04/27/2015 at 0830    Assessment: 29yo woman re-admitted 04/28/2015 for VTE. Previously admitted at end of last month for RLE pain later assoc w/ pleuritic CP and dyspnea. No recent travel, not a smoker, FH of thromboembolic events, and inactive lifestyle. 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 Pharmacy consulted to dose heparin.  PMH depression, anxiety, morbid obesity  9/8 Duplex positive  for DVT of R common fem vein, fem vein, profunda vein, & popliteal vein. On enoxaparin 135 mg q12h subcut PTA, last dose evening of 9/11. Started on heparin 800 units/h, also on tenecteplase 0.5 mg/h. CBC stable.   Goal of Therapy:  Heparin level 0.2-0.5 units/ml Monitor platelets by anticoagulation protocol: Yes   Plan:  Continue heparin 800 units/h HL 6 hr Monitor HL, fibrinogen, and CBC while on heparin and tenecteplase Monitor s/sx bleeding   Hillery Aldo, Pharm.D. PGY2 Cardiology Pharmacy Resident Pager: [phone]  04/28/2015,12:29 PM    Addendum:   Initial HL 0.57 prior to heparin gtt initiation, likely d/t therapeutic effect of enoxaparin PTA. Continue heparin 800 units/h for now and decrease dose if 1700 HL elevated. Previously on heparin 2250 units/h during last admission with aPTT 65 & HL 0.54.   Hillery Aldo, [place].D. PGY2 Cardiology Pharmacy Resident Pager: [phone] 04/28/2015,2:51 PM

## 2015-04-28 NOTE — H&P (Signed)
Chief Complaint: Patient was seen in consultation today for Rt LE DVT- recurrence at the request of [facility]  Referring Physician(s): Danie Binder  History of Present Illness: Christina Simmons is a 29 y.o. female   Pt known to IR R LE DVT venous thrombolysis 8/23-24, 2016 Pt had good result  IMPRESSION: 1. Significant clearance of occlusive femoral popliteal thrombus after overnight catheter directed ultrasound assisted thrombolytic infusion.  2. Residual partially occlusive mural thrombus in the distal femoral and proximal popliteal veins responded well to Angiojet mechanical Thrombectomy.  Follow up venous doppler 9/8 reveals; IMPRESSION: Right lower extremity duplex survey is positive for DVT involving the common femoral vein, femoral vein, profunda vein, and popliteal Vein.  Pt on Lovenox BID Last dose 9/11 9pm Non smoker Notes Rt leg swelling when up walking on it--resolves when lying down Notes pain higher in Rt leg this time---even in hip area Scheduled now for Rt Lower Extr venogram with possible thrombolysis/thrombectomy; possible Angioplasty/stent placement  Past Medical History  Diagnosis Date  . Obesity   . GERD (gastroesophageal reflux disease)   . Depression with anxiety   . Bilateral pulmonary embolism   . DVT (deep venous thrombosis) August 2016    RLE    History reviewed. No pertinent past surgical history.  Allergies: Amoxicillin  Medications: Prior to Admission medications   Medication Sig Start Date End Date Taking? Authorizing Provider  buPROPion (WELLBUTRIN XL) 150 MG 24 hr tablet Take 150 mg by mouth daily.   Yes Historical Provider, MD  enoxaparin (LOVENOX) 150 MG/ML injection Inject 0.89 mLs (135 mg total) into the skin every 12 (twelve) hours. 04/12/15  Yes Dorothea Ogle, MD  oxyCODONE (OXY IR/ROXICODONE) 5 MG immediate release tablet Take 1 tablet (5 mg total) by mouth every 3 (three) hours as needed for moderate pain, severe pain or  breakthrough pain. 04/12/15  Yes Dorothea Ogle, MD  sertraline (ZOLOFT) 100 MG tablet Take 100 mg by mouth daily.   Yes Historical Provider, MD     History reviewed. No pertinent family history.  Social History   Social History  . Marital Status: Married    Spouse Name: N/A  . Number of Children: N/A  . Years of Education: N/A   Social History Main Topics  . Smoking status: Never Smoker   . Smokeless tobacco: None  . Alcohol Use: No  . Drug Use: No  . Sexual Activity: Yes    Birth Control/ Protection: Pill   Other Topics Concern  . None   Social History Narrative     Review of Systems: A 12 point ROS discussed and pertinent positives are indicated in the HPI above.  All other systems are negative.  Review of Systems  Constitutional: Positive for activity change. Negative for fever and appetite change.  Respiratory: Negative for shortness of breath and wheezing.   Musculoskeletal: Positive for back pain.  Neurological: Negative for weakness.  Psychiatric/Behavioral: Negative for behavioral problems and confusion.    Vital Signs: BP 148/81 mmHg  Pulse 91  Temp(Src) 98.1 F (36.7 C)  Resp 18  Ht 5\' 1"  (1.549 m)  Wt 285 lb (129.275 kg)  BMI 53.88 kg/m2  SpO2 99%  LMP 04/07/2015 (Approximate)  Physical Exam  Constitutional: She is oriented to person, place, and time.  Cardiovascular: Normal rate, regular rhythm and normal heart sounds.   No murmur heard. Pulmonary/Chest: Effort normal and breath sounds normal. She has no wheezes.  Abdominal: Soft. Bowel sounds are normal. There  is no tenderness.  Musculoskeletal: Normal range of motion.  Rt leg: no noticeable swelling NT Color wnl Rt foot 1+ pulses  Neurological: She is alert and oriented to person, place, and time.  Skin: Skin is warm and dry.  Psychiatric: She has a normal mood and affect. Her behavior is normal. Judgment and thought content normal.  Nursing note and vitals reviewed.   Mallampati  Score:  MD Evaluation Airway: WNL Heart: WNL Abdomen: WNL Chest/ Lungs: WNL ASA  Classification: 2 Mallampati/Airway Score: Two  Imaging: Ir Veno/ext/uni Right  04/08/2015   CLINICAL DATA:  Acute right lower extremity symptomatic femoral popliteal DVT for 4 days. Associated acute pulmonary embolus. Right lower extremity remains symptomatic despite 4 days of anticoagulation.  EXAM: ULTRASOUND GUIDANCE FOR VENOUS ACCESS  RIGHT LOWER EXTREMITY VENOGRAM  INITIATION OF RIGHT LOWER EXTREMITY FEMORAL POPLITEAL DVT ULTRASOUND ASSISTED TRANSCATHETER THROMBOLYSIS  Date:  8/23/20168/23/2016 4:17 pm  Radiologist:  M. Ruel Favors, MD  Guidance:  ULTRASOUND AND FLUOROSCOPIC  FLUOROSCOPY TIME:  4 MINUTES 48 SECONDS, 763 MGY  MEDICATIONS AND MEDICAL HISTORY: 1 mg Versed, 50 mcg fentanyl  ANESTHESIA/SEDATION: 30 minutes  CONTRAST:  60mL OMNIPAQUE IOHEXOL 300 MG/ML  SOLN  COMPLICATIONS: None immediate  PROCEDURE: Informed consent was obtained from the patient following explanation of the procedure, risks, benefits and alternatives. The patient understands, agrees and consents for the procedure. All questions were addressed. A time out was performed.  Maximal barrier sterile technique utilized including caps, mask, sterile gowns, sterile gloves, large sterile drape, hand hygiene, and ChloraPrep.  Previous imaging reviewed.  Patient positioned prone. Preliminary ultrasound performed. Occlusive thrombus present in the popliteal and femoral veins extending into the upper calf. Under sterile conditions and local anesthesia, ultrasound micropuncture access performed of the popliteal vein. Guidewire advanced easily under fluoroscopy and ultrasound. Four [place] dilator inserted. Bentson guidewire advanced. Six French sheath placed.  Five [place] Kumpe catheter advanced. Contrast injection performed for a a right lower extremity venogram.  Right lower extremity venogram: This demonstrates occlusive thrombus in the femoral  popliteal veins extending to the common femoral vein. Catheter was advanced into the iliac vein. Iliac venogram confirms patency of the iliac veins and lower IVC.  Measurements obtained for the appropriate infusion catheter length.  Right lower extremity transcatheter thrombolysis: Over a Rosen exchange guidewire, an ultrasound assisted EKOS infusion catheter was advanced with a 40 cm infusion length to traverse the femoral popliteal DVT. Images obtained for documentation. Catheter secured externally.  Transcatheter ultrasound assisted DVT lysis will begin at 1 milligram/hour. Follow-up venogram performed tomorrow after at least 18 hours of lysis.  Patient tolerated the procedure well. Findings discussed with the patient and family.  IMPRESSION: Successful ultrasound and fluoroscopic right lower extremity venogram confirms occlusive acute right femoral popliteal DVT.  Successful insertion of an ultrasound assisted EKOS infusion catheter traversing the femoral popliteal DVT to begin transcatheter thrombolysis as above.   Electronically Signed   By: Judie Petit.  Shick M.D.   On: 04/08/2015 16:46   Ir Caffie Damme Ivc  04/09/2015   INDICATION: Right lower extremity occlusive femoral popliteal DVT, post overnight catheter directed ultrasound assisted thrombolytic infusion.  EXAM: 1. FOLLOW-UP RIGHT LOWER EXTREMITY VENOGRAPHY 2. MECHANICAL VENOUS THROMBECTOMY  COMPARISON:  previous day's exam  MEDICATIONS: Intravenous Fentanyl and Versed were administered as conscious sedation during continuous cardiorespiratory monitoring by the radiology RN, with a total moderate sedation time of 15 minutes.  CONTRAST:  60mL OMNIPAQUE IOHEXOL 300 MG/ML SOLN39mL OMNIPAQUE IOHEXOL 300 MG/ML SOLN25mL OMNIPAQUE IOHEXOL  300 MG/ML SOLN  FLUOROSCOPY TIME:  2 minutes 24 seconds, 347 mGy.  COMPLICATIONS: None immediate  TECHNIQUE: Patient placed prone. Right pelvic and lower extremity venography performed through the previously placed right popliteal  venous sheath. The infusion catheter was removed. Sheath and surrounding skin prepped and draped in usual sterile fashion. Maximal barrier sterile technique was utilized including caps, mask, sterile gowns, sterile gloves, sterile drape, hand hygiene and skin antiseptic. Skin entry site of catheter infiltrated with 1% lidocaine. Catheter exchanged over a Bentson wire for an 8 [place] Angiojet catheter, a used for mechanical thrombolysis of residual femoral vein and popliteal vein partially occlusive DVT. The 6 French sheath was replaced and follow-up venography showed clearance of residual thrombus. Catheter removed and hemostasis achieved with manual compression.  The patient tolerated the procedure well without immediate postprocedural complication.  FINDINGS: Right lower extremity and pelvic venography with catheter in place demonstrated residual partially occlusive thrombus in the proximal popliteal vein and distal femoral vein. The more central femoral vein thrombus had largely resolved. Iliac venous system remain widely patent.  After additional mechanical thrombectomy of the distal femoral vein and proximal popliteal vein thrombus, final venography shows no significant residual thrombus in the visualized femoral-popliteal system. Iliac venous system remains widely patent.  IMPRESSION: 1. Significant clearance of occlusive femoral popliteal thrombus after overnight catheter directed ultrasound assisted thrombolytic infusion.  2. Residual partially occlusive mural thrombus in the distal femoral and proximal popliteal veins responded well to Angiojet mechanical thrombectomy.  Recommend continued anticoagulation including weight based subcutaneous Lovenox for at least 30 days. Follow up U/S and IR clinic visit   [phone] in 2 wks.   Electronically Signed   By: Corlis Leak M.D.   On: 04/09/2015 15:17   US Venous Img Lower Unilateral Right  04/24/2015   CLINICAL DATA:  29 year old female with a history of right  lower extremity DVT, diagnosed April 08, 2015. She underwent catheter directed thrombo lie cysts 04/08/2015 and 04/09/2015 completion.  She was discharged from the hospital on weight based Lovenox therapy and returns today for evaluation.  Initial symptoms improved, and have again worsened in the right leg over the past few days.  EXAM: RIGHT LOWER EXTREMITY VENOUS DOPPLER ULTRASOUND  TECHNIQUE: Gray-scale sonography with graded compression, as well as color Doppler and duplex ultrasound were performed to evaluate the lower extremity deep venous systems from the level of the common femoral vein and including the common femoral, femoral, profunda femoral, popliteal and calf veins including the posterior tibial, peroneal and gastrocnemius veins when visible. The superficial great saphenous vein was also interrogated. Spectral Doppler was utilized to evaluate flow at rest and with distal augmentation maneuvers in the common femoral, femoral and popliteal veins.  COMPARISON:  Angiographic study 04/08/2015 and 04/09/2015  FINDINGS: Contralateral Common Femoral Vein: Respiratory phasicity is normal and symmetric with the symptomatic side. No evidence of thrombus. Normal compressibility.  Common Femoral Vein: Partial compressibility of the proximal right common femoral vein though flow is maintained. Respiratory phasicity is maintained of the proximal common femoral vein, as well as augmentation with Valsalva. There is clear evidence of thrombus within the distal common femoral vein extending through the femoral vein into the popliteal vein  Saphenofemoral Junction: No evidence of thrombus. Normal compressibility and flow on color Doppler imaging.  Profunda Femoral Vein: Occlusive thrombus involves the profunda vein.  Femoral Vein: Occlusive thrombus through the length of the femoral vein.  Popliteal Vein: Occlusive thrombus of the popliteal vein.  Superficial Great  Saphenous Vein: No evidence of thrombus. Normal  compressibility and flow on color Doppler imaging.  Other Findings:  None.  IMPRESSION: Right lower extremity duplex survey is positive for DVT involving the common femoral vein, femoral vein, profunda vein, and popliteal vein.  Signed,  Yvone Neu. Loreta Ave, DO  [facility]  [facility]   Electronically Signed   By: Gilmer Mor D.O.   On: 04/24/2015 13:59   Ir Thrombect Veno Mech Mod Sed  04/09/2015   INDICATION: Right lower extremity occlusive femoral popliteal DVT, post overnight catheter directed ultrasound assisted thrombolytic infusion.  EXAM: 1. FOLLOW-UP RIGHT LOWER EXTREMITY VENOGRAPHY 2. MECHANICAL VENOUS THROMBECTOMY  COMPARISON:  previous day's exam  MEDICATIONS: Intravenous Fentanyl and Versed were administered as conscious sedation during continuous cardiorespiratory monitoring by the radiology RN, with a total moderate sedation time of 15 minutes.  CONTRAST:  60mL OMNIPAQUE IOHEXOL 300 MG/ML SOLN62mL OMNIPAQUE IOHEXOL 300 MG/ML SOLN76mL OMNIPAQUE IOHEXOL 300 MG/ML SOLN  FLUOROSCOPY TIME:  2 minutes 24 seconds, 347 mGy.  COMPLICATIONS: None immediate  TECHNIQUE: Patient placed prone. Right pelvic and lower extremity venography performed through the previously placed right popliteal venous sheath. The infusion catheter was removed. Sheath and surrounding skin prepped and draped in usual sterile fashion. Maximal barrier sterile technique was utilized including caps, mask, sterile gowns, sterile gloves, sterile drape, hand hygiene and skin antiseptic. Skin entry site of catheter infiltrated with 1% lidocaine. Catheter exchanged over a Bentson wire for an 8 [place] Angiojet catheter, a used for mechanical thrombolysis of residual femoral vein and popliteal vein partially occlusive DVT. The 6 French sheath was replaced and follow-up venography showed clearance of residual thrombus. Catheter removed and hemostasis achieved with manual compression.  The patient  tolerated the procedure well without immediate postprocedural complication.  FINDINGS: Right lower extremity and pelvic venography with catheter in place demonstrated residual partially occlusive thrombus in the proximal popliteal vein and distal femoral vein. The more central femoral vein thrombus had largely resolved. Iliac venous system remain widely patent.  After additional mechanical thrombectomy of the distal femoral vein and proximal popliteal vein thrombus, final venography shows no significant residual thrombus in the visualized femoral-popliteal system. Iliac venous system remains widely patent.  IMPRESSION: 1. Significant clearance of occlusive femoral popliteal thrombus after overnight catheter directed ultrasound assisted thrombolytic infusion.  2. Residual partially occlusive mural thrombus in the distal femoral and proximal popliteal veins responded well to Angiojet mechanical thrombectomy.  Recommend continued anticoagulation including weight based subcutaneous Lovenox for at least 30 days. Follow up U/S and IR clinic visit   [phone] in 2 wks.   Electronically Signed   By: Corlis Leak M.D.   On: 04/09/2015 15:17   Ir US Guide Vasc Access Right  04/08/2015   CLINICAL DATA:  Acute right lower extremity symptomatic femoral popliteal DVT for 4 days. Associated acute pulmonary embolus. Right lower extremity remains symptomatic despite 4 days of anticoagulation.  EXAM: ULTRASOUND GUIDANCE FOR VENOUS ACCESS  RIGHT LOWER EXTREMITY VENOGRAM  INITIATION OF RIGHT LOWER EXTREMITY FEMORAL POPLITEAL DVT ULTRASOUND ASSISTED TRANSCATHETER THROMBOLYSIS  Date:  8/23/20168/23/2016 4:17 pm  Radiologist:  M. Ruel Favors, MD  Guidance:  ULTRASOUND AND FLUOROSCOPIC  FLUOROSCOPY TIME:  4 MINUTES 48 SECONDS, 763 MGY  MEDICATIONS AND MEDICAL HISTORY: 1 mg Versed, 50 mcg fentanyl  ANESTHESIA/SEDATION: 30 minutes  CONTRAST:  60mL OMNIPAQUE IOHEXOL 300 MG/ML  SOLN  COMPLICATIONS: None immediate  PROCEDURE: Informed consent  was obtained from the patient following explanation of  the procedure, risks, benefits and alternatives. The patient understands, agrees and consents for the procedure. All questions were addressed. A time out was performed.  Maximal barrier sterile technique utilized including caps, mask, sterile gowns, sterile gloves, large sterile drape, hand hygiene, and ChloraPrep.  Previous imaging reviewed.  Patient positioned prone. Preliminary ultrasound performed. Occlusive thrombus present in the popliteal and femoral veins extending into the upper calf. Under sterile conditions and local anesthesia, ultrasound micropuncture access performed of the popliteal vein. Guidewire advanced easily under fluoroscopy and ultrasound. Four [place] dilator inserted. Bentson guidewire advanced. Six French sheath placed.  Five [place] Kumpe catheter advanced. Contrast injection performed for a a right lower extremity venogram.  Right lower extremity venogram: This demonstrates occlusive thrombus in the femoral popliteal veins extending to the common femoral vein. Catheter was advanced into the iliac vein. Iliac venogram confirms patency of the iliac veins and lower IVC.  Measurements obtained for the appropriate infusion catheter length.  Right lower extremity transcatheter thrombolysis: Over a Rosen exchange guidewire, an ultrasound assisted EKOS infusion catheter was advanced with a 40 cm infusion length to traverse the femoral popliteal DVT. Images obtained for documentation. Catheter secured externally.  Transcatheter ultrasound assisted DVT lysis will begin at 1 milligram/hour. Follow-up venogram performed tomorrow after at least 18 hours of lysis.  Patient tolerated the procedure well. Findings discussed with the patient and family.  IMPRESSION: Successful ultrasound and fluoroscopic right lower extremity venogram confirms occlusive acute right femoral popliteal DVT.  Successful insertion of an ultrasound assisted EKOS infusion  catheter traversing the femoral popliteal DVT to begin transcatheter thrombolysis as above.   Electronically Signed   By: Judie Petit.  Shick M.D.   On: 04/08/2015 16:46   Ir Infusion Thrombol Venous Initial (ms)  04/08/2015   CLINICAL DATA:  Acute right lower extremity symptomatic femoral popliteal DVT for 4 days. Associated acute pulmonary embolus. Right lower extremity remains symptomatic despite 4 days of anticoagulation.  EXAM: ULTRASOUND GUIDANCE FOR VENOUS ACCESS  RIGHT LOWER EXTREMITY VENOGRAM  INITIATION OF RIGHT LOWER EXTREMITY FEMORAL POPLITEAL DVT ULTRASOUND ASSISTED TRANSCATHETER THROMBOLYSIS  Date:  8/23/20168/23/2016 4:17 pm  Radiologist:  M. Ruel Favors, MD  Guidance:  ULTRASOUND AND FLUOROSCOPIC  FLUOROSCOPY TIME:  4 MINUTES 48 SECONDS, 763 MGY  MEDICATIONS AND MEDICAL HISTORY: 1 mg Versed, 50 mcg fentanyl  ANESTHESIA/SEDATION: 30 minutes  CONTRAST:  60mL OMNIPAQUE IOHEXOL 300 MG/ML  SOLN  COMPLICATIONS: None immediate  PROCEDURE: Informed consent was obtained from the patient following explanation of the procedure, risks, benefits and alternatives. The patient understands, agrees and consents for the procedure. All questions were addressed. A time out was performed.  Maximal barrier sterile technique utilized including caps, mask, sterile gowns, sterile gloves, large sterile drape, hand hygiene, and ChloraPrep.  Previous imaging reviewed.  Patient positioned prone. Preliminary ultrasound performed. Occlusive thrombus present in the popliteal and femoral veins extending into the upper calf. Under sterile conditions and local anesthesia, ultrasound micropuncture access performed of the popliteal vein. Guidewire advanced easily under fluoroscopy and ultrasound. Four [place] dilator inserted. Bentson guidewire advanced. Six French sheath placed.  Five [place] Kumpe catheter advanced. Contrast injection performed for a a right lower extremity venogram.  Right lower extremity venogram: This demonstrates  occlusive thrombus in the femoral popliteal veins extending to the common femoral vein. Catheter was advanced into the iliac vein. Iliac venogram confirms patency of the iliac veins and lower IVC.  Measurements obtained for the appropriate infusion catheter length.  Right lower extremity transcatheter thrombolysis: Over a  Rosen exchange guidewire, an ultrasound assisted EKOS infusion catheter was advanced with a 40 cm infusion length to traverse the femoral popliteal DVT. Images obtained for documentation. Catheter secured externally.  Transcatheter ultrasound assisted DVT lysis will begin at 1 milligram/hour. Follow-up venogram performed tomorrow after at least 18 hours of lysis.  Patient tolerated the procedure well. Findings discussed with the patient and family.  IMPRESSION: Successful ultrasound and fluoroscopic right lower extremity venogram confirms occlusive acute right femoral popliteal DVT.  Successful insertion of an ultrasound assisted EKOS infusion catheter traversing the femoral popliteal DVT to begin transcatheter thrombolysis as above.   Electronically Signed   By: Judie Petit.  Shick M.D.   On: 04/08/2015 16:46   Ir Rande Lawman F/u Eval Art/ven Final Day (ms)  04/09/2015   INDICATION: Right lower extremity occlusive femoral popliteal DVT, post overnight catheter directed ultrasound assisted thrombolytic infusion.  EXAM: 1. FOLLOW-UP RIGHT LOWER EXTREMITY VENOGRAPHY 2. MECHANICAL VENOUS THROMBECTOMY  COMPARISON:  previous day's exam  MEDICATIONS: Intravenous Fentanyl and Versed were administered as conscious sedation during continuous cardiorespiratory monitoring by the radiology RN, with a total moderate sedation time of 15 minutes.  CONTRAST:  60mL OMNIPAQUE IOHEXOL 300 MG/ML SOLN49mL OMNIPAQUE IOHEXOL 300 MG/ML SOLN70mL OMNIPAQUE IOHEXOL 300 MG/ML SOLN  FLUOROSCOPY TIME:  2 minutes 24 seconds, 347 mGy.  COMPLICATIONS: None immediate  TECHNIQUE: Patient placed prone. Right pelvic and lower extremity venography  performed through the previously placed right popliteal venous sheath. The infusion catheter was removed. Sheath and surrounding skin prepped and draped in usual sterile fashion. Maximal barrier sterile technique was utilized including caps, mask, sterile gowns, sterile gloves, sterile drape, hand hygiene and skin antiseptic. Skin entry site of catheter infiltrated with 1% lidocaine. Catheter exchanged over a Bentson wire for an 8 [place] Angiojet catheter, a used for mechanical thrombolysis of residual femoral vein and popliteal vein partially occlusive DVT. The 6 French sheath was replaced and follow-up venography showed clearance of residual thrombus. Catheter removed and hemostasis achieved with manual compression.  The patient tolerated the procedure well without immediate postprocedural complication.  FINDINGS: Right lower extremity and pelvic venography with catheter in place demonstrated residual partially occlusive thrombus in the proximal popliteal vein and distal femoral vein. The more central femoral vein thrombus had largely resolved. Iliac venous system remain widely patent.  After additional mechanical thrombectomy of the distal femoral vein and proximal popliteal vein thrombus, final venography shows no significant residual thrombus in the visualized femoral-popliteal system. Iliac venous system remains widely patent.  IMPRESSION: 1. Significant clearance of occlusive femoral popliteal thrombus after overnight catheter directed ultrasound assisted thrombolytic infusion.  2. Residual partially occlusive mural thrombus in the distal femoral and proximal popliteal veins responded well to Angiojet mechanical thrombectomy.  Recommend continued anticoagulation including weight based subcutaneous Lovenox for at least 30 days. Follow up U/S and IR clinic visit   [phone] in 2 wks.   Electronically Signed   By: Corlis Leak M.D.   On: 04/09/2015 15:17    Labs:  CBC:  Recent Labs  04/10/15 0442  04/11/15 0614 04/12/15 0920 04/28/15 0716  WBC 9.4 8.9 7.5 7.2  HGB 9.6* 10.0* 10.0* 10.9*  HCT 29.9* 31.7* 32.5* 35.8*  PLT 244 282 337 266    COAGS:  Recent Labs  04/08/15 1146  04/10/15 1617 04/10/15 2333 04/11/15 0614 04/11/15 1450 04/28/15 0716  INR 1.43  --   --   --   --   --  1.10  APTT 42*  < > 53*  64* 65* 64*  --   < > = values in this interval not displayed.  BMP:  Recent Labs  04/09/15 0447 04/10/15 0442 04/11/15 0614 04/28/15 0716  NA 132* 136 134* 136  K 3.8 3.8 3.6 3.9  CL 99* 102 99* 104  CO2 24 27 25 24   GLUCOSE 84 104* 94 94  BUN 5* <5* 8 13  CALCIUM 7.9* 8.3* 8.5* 9.0  CREATININE 0.68 0.63 0.65 0.81  GFRNONAA >60 >60 >60 >60  GFRAA >60 >60 >60 >60    LIVER FUNCTION TESTS:  Recent Labs  04/08/15 0808 04/09/15 0447 04/10/15 0442 04/11/15 0614  BILITOT 0.4 0.9 0.4 0.3  AST 29 23 18 28   ALT 14 16 16 25   ALKPHOS 84 73 94 96  PROT 7.1 6.2* 6.3* 6.8  ALBUMIN 2.3* 2.0* 2.0* 2.3*    TUMOR MARKERS: No results for input(s): AFPTM, CEA, CA199, CHROMGRNA in the last 8760 hours.  Assessment and Plan:  Recurrent Rt LE DVT Scheduled now for RLE venogram with thrombolysis/thrombectomy; possible pta/stent Pt and family aware of procedure benefits and risks including but not limited to: Infection; bleeding; vessel damage; bleeding at remote site; death Agreeable to proceed Consent signed andin chart  Thank you for this interesting consult.  I greatly enjoyed meeting [employer] and look forward to participating in their care.  A copy of this report was sent to the requesting provider on this date.  Signed: Marqueta Pulley A 04/28/2015, 7:57 AM   I spent a total of  30 Minutes   in face to face in clinical consultation, greater than 50% of which was counseling/coordinating care for RLE DVT lysis; pta/stent

## 2015-04-28 NOTE — Sedation Documentation (Signed)
Patient denies pain and is resting comfortably.  

## 2015-04-28 NOTE — Procedures (Signed)
Successful placement of right brachial vein approach 42 cm dual lumen PICC line with tip at the superior caval-atrial junction.   The PICC line is ready for immediate use.  Successful initiation of right lower extremity thrombolysis for residual/recurrent R femoral and popliteal DVT. Keep right leg straight while catheter in place.  Katherina Right, MD Pager #: [phone]

## 2015-04-28 NOTE — Progress Notes (Signed)
Bladder scanned pt at this time. 30 cc of urine in bladder. MD paged. Order for foley catheter to see if pts pressure relieved from her bladder.

## 2015-04-28 NOTE — Sedation Documentation (Signed)
Family updated as to patient's status by Dr. Grace Isaac

## 2015-04-29 ENCOUNTER — Inpatient Hospital Stay (HOSPITAL_COMMUNITY): Payer: Medicaid Other

## 2015-04-29 DIAGNOSIS — Z6841 Body Mass Index (BMI) 40.0 and over, adult: Secondary | ICD-10-CM | POA: Diagnosis not present

## 2015-04-29 DIAGNOSIS — I82411 Acute embolism and thrombosis of right femoral vein: Secondary | ICD-10-CM | POA: Diagnosis present

## 2015-04-29 DIAGNOSIS — Z7901 Long term (current) use of anticoagulants: Secondary | ICD-10-CM | POA: Diagnosis not present

## 2015-04-29 DIAGNOSIS — F418 Other specified anxiety disorders: Secondary | ICD-10-CM | POA: Diagnosis present

## 2015-04-29 DIAGNOSIS — K219 Gastro-esophageal reflux disease without esophagitis: Secondary | ICD-10-CM | POA: Diagnosis present

## 2015-04-29 DIAGNOSIS — I82431 Acute embolism and thrombosis of right popliteal vein: Secondary | ICD-10-CM | POA: Diagnosis present

## 2015-04-29 DIAGNOSIS — Z86711 Personal history of pulmonary embolism: Secondary | ICD-10-CM | POA: Diagnosis not present

## 2015-04-29 LAB — BASIC METABOLIC PANEL
ANION GAP: 7 (ref 5–15)
BUN: 7 mg/dL (ref 6–20)
CALCIUM: 8.9 mg/dL (ref 8.9–10.3)
CO2: 25 mmol/L (ref 22–32)
Chloride: 102 mmol/L (ref 101–111)
Creatinine, Ser: 0.74 mg/dL (ref 0.44–1.00)
GFR calc Af Amer: >60 mL/min (ref >60)
GLUCOSE: 93 mg/dL (ref 65–99)
Potassium: 4 mmol/L (ref 3.5–5.1)
SODIUM: 134 mmol/L — AB (ref 135–145)

## 2015-04-29 LAB — FIBRINOGEN
FIBRINOGEN: 168 mg/dL — AB (ref 204–475)
FIBRINOGEN: 125 mg/dL — AB (ref 204–475)
Fibrinogen: 252 mg/dL (ref 204–475)

## 2015-04-29 LAB — CBC
HCT: 34.6 % — ABNORMAL LOW (ref 36.0–46.0)
Hemoglobin: 11.1 g/dL — ABNORMAL LOW (ref 12.0–15.0)
MCH: 27.3 pg (ref 26.0–34.0)
MCHC: 32.1 g/dL (ref 30.0–36.0)
MCV: 85.2 fL (ref 78.0–100.0)
PLATELETS: 195 10*3/uL (ref 150–400)
RBC: 4.06 MIL/uL (ref 3.87–5.11)
RDW: 13.7 % (ref 11.5–15.5)
WBC: 6.1 10*3/uL (ref 4.0–10.5)

## 2015-04-29 LAB — HEPARIN LEVEL (UNFRACTIONATED)
HEPARIN UNFRACTIONATED: 0.13 [IU]/mL — AB (ref 0.30–0.70)
Heparin Unfractionated: 0.18 IU/mL — ABNORMAL LOW (ref 0.30–0.70)

## 2015-04-29 MED ORDER — MIDAZOLAM HCL 2 MG/2ML IJ SOLN
INTRAMUSCULAR | Status: AC
  Filled 2015-04-29: qty 4

## 2015-04-29 MED ORDER — ENOXAPARIN SODIUM 150 MG/ML ~~LOC~~ SOLN
1.0000 mg/kg | Freq: Two times a day (BID) | SUBCUTANEOUS | Status: DC
  Administered 2015-04-29 – 2015-04-30 (×2): 130 mg via SUBCUTANEOUS
  Filled 2015-04-29 (×4): qty 1

## 2015-04-29 MED ORDER — CHLORHEXIDINE GLUCONATE 4 % EX LIQD
CUTANEOUS | Status: AC
  Filled 2015-04-29: qty 15

## 2015-04-29 MED ORDER — LIDOCAINE HCL 1 % IJ SOLN
INTRAMUSCULAR | Status: AC
Start: 2015-04-29 — End: 2015-04-30
  Filled 2015-04-29: qty 20

## 2015-04-29 MED ORDER — HYDROCODONE-ACETAMINOPHEN 5-325 MG PO TABS: 1.0000 | ORAL_TABLET | ORAL | Status: DC | PRN

## 2015-04-29 MED ORDER — MIDAZOLAM HCL 2 MG/2ML IJ SOLN
INTRAMUSCULAR | Status: AC | PRN
  Administered 2015-04-29: 0.5 mg via INTRAVENOUS
  Administered 2015-04-29 (×2): 1 mg via INTRAVENOUS

## 2015-04-29 MED ORDER — HEPARIN (PORCINE) IN NACL 100-0.45 UNIT/ML-% IJ SOLN
950.0000 [IU]/h | INTRAMUSCULAR | Status: AC
  Administered 2015-04-29: 950 [IU]/h via INTRAVENOUS

## 2015-04-29 MED ORDER — FENTANYL CITRATE (PF) 100 MCG/2ML IJ SOLN
INTRAMUSCULAR | Status: AC
  Filled 2015-04-29: qty 4

## 2015-04-29 MED ORDER — FENTANYL CITRATE (PF) 100 MCG/2ML IJ SOLN
INTRAMUSCULAR | Status: AC | PRN
  Administered 2015-04-29: 50 ug via INTRAVENOUS
  Administered 2015-04-29: 25 ug via INTRAVENOUS
  Administered 2015-04-29 (×2): 50 ug via INTRAVENOUS

## 2015-04-29 MED ORDER — ENOXAPARIN SODIUM 150 MG/ML ~~LOC~~ SOLN: 1.0000 mg/kg | Freq: Two times a day (BID) | SUBCUTANEOUS | Status: DC

## 2015-04-29 NOTE — Sedation Documentation (Signed)
Patient c/o of R leg pain

## 2015-04-29 NOTE — Sedation Documentation (Signed)
Patient is resting comfortably. 

## 2015-04-29 NOTE — Procedures (Signed)
Technically successful catheter directed right lower extremity thrombolysis with restoration of antegrade flow. Hemostasis was achieved at the right popliteal access site with manual compression. No immediate post procedural complications.  Katherina Right, MD Pager #: [phone]

## 2015-04-29 NOTE — Sedation Documentation (Signed)
Patient c/o pain.  

## 2015-04-29 NOTE — Sedation Documentation (Signed)
Patient c/o pain, requests pain medication

## 2015-04-29 NOTE — Progress Notes (Signed)
ANTICOAGULATION CONSULT NOTE - Follow Up Consult  Pharmacy Consult for heparin Indication: pulmonary embolus and DVT   Labs:  Recent Labs  04/28/15 0716  04/28/15 1053 04/28/15 1800 04/28/15 2000 04/29/15 0443  HGB 10.9*  --  11.3*  --   --   --   HCT 35.8*  --  36.5  --   --   --   PLT 266  --  240  --   --   --   LABPROT 14.4  --   --   --   --   --   INR 1.10  --   --   --   --   --   HEPARINUNFRC  --   < > 0.57 0.43 0.68 0.18*  CREATININE 0.81  --   --   --   --   --   < > = values in this interval not displayed.    Assessment: 29yo female now subtherapeutic on heparin after small rate adjustment.  Goal of Therapy:  Heparin level 0.2-0.5 units/ml   Plan:  Will increase heparin gtt very slightly to 750 units/hr and check level in 6hr.  Vernard Gambles, PharmD, BCPS  04/29/2015,5:33 AM

## 2015-04-29 NOTE — Sedation Documentation (Addendum)
Patient is complaining of pain in right leg

## 2015-04-29 NOTE — Progress Notes (Signed)
ANTICOAGULATION CONSULT NOTE - Follow Up Consult  Pharmacy Consult for heparin Indication: pulmonary embolus and DVT   Labs:  Recent Labs  04/28/15 0716 04/28/15 1053  04/28/15 2000 04/29/15 0443 04/29/15 0600 04/29/15 1130 04/29/15 1443  HGB 10.9* 11.3*  --   --   --   --  11.1*  --   HCT 35.8* 36.5  --   --   --   --  34.6*  --   PLT 266 240  --   --   --   --  195  --   LABPROT 14.4  --   --   --   --   --   --   --   INR 1.10  --   --   --   --   --   --   --   HEPARINUNFRC  --  0.57  < > 0.68 0.18*  --   --  0.13*  CREATININE 0.81  --   --   --   --  0.74  --   --   < > = values in this interval not displayed.  Assessment: 29yo female with DVT on heparin and tenectaplase. Heparin level was low at 0.13, then the drip was stopped (at 750 units/hr) to removed the sheath.   Goal of Therapy:  Heparin level 0.2-0.5 units/ml   Plan:  Increase heparin to 950 units/hr to start now.  Continue heparin until 2000, then d/c and give first dose of enoxaparin  Isaac Bliss, PharmD, [facility] Clinical Pharmacist Pager [phone] 04/29/2015 5:55 PM

## 2015-04-29 NOTE — Progress Notes (Signed)
ANTICOAGULATION CONSULT NOTE - Follow-up Consult  Pharmacy Consult for heparin Indication: pulmonary embolus and DVT  Allergies  Allergen Reactions  . Amoxicillin     Childhood     Patient Measurements: Height:  (162.6 cm) Weight: 281 lb 15.5 oz (127.9 kg) IBW/kg (Calculated) : 54.7 Heparin Dosing Weight: 86.3 kg  Vital Signs: Temp: 98 F (36.7 C) (09/13 0000) Temp Source: Oral (09/13 0000) BP: 112/60 mmHg (09/12 2300) Pulse Rate: 86 (09/12 2300)  Labs:  Recent Labs  04/28/15 0716 04/28/15 1053 04/28/15 1800 04/28/15 2000  HGB 10.9* 11.3*  --   --   HCT 35.8* 36.5  --   --   PLT 266 240  --   --   LABPROT 14.4  --   --   --   INR 1.10  --   --   --   HEPARINUNFRC  --  0.57 0.43 0.68  CREATININE 0.81  --   --   --     Estimated Creatinine Clearance: 135.9 mL/min (by C-G formula based on Cr of 0.81).  Assessment: 29yo woman on heparin for residual/recurrent R femoral and popliteal DVT. Pt continues on tenecteplase 0.5mg /hr for thrombolysis. Heparin level repeated ~2000 on 9/12 and is supratherapeutic (0.68) - appeared to be trending up. No bleeding noted.   Noted that initial HL 0.57 prior to heparin gtt initiation, likely d/t therapeutic effect of enoxaparin PTA (last dose 9/11 2100).   Goal of Therapy:  Heparin level 0.2-0.5 units/ml Monitor platelets by anticoagulation protocol: Yes   Plan:  Decrease heparin to 700 units/hr F/u heparin level in 6 hr  Christoper Fabian, PharmD, BCPS Clinical pharmacist, pager [phone] 04/29/2015,1:27 AM

## 2015-04-30 NOTE — Discharge Summary (Signed)
Patient ID: Christina Simmons MRN: [id number] DOB/AGE: Jul 23, 1986 29 y.o.  Admit date: 04/28/2015 Discharge date: 04/30/2015  Admission Diagnoses: Right lower extremity deep vein thromosis  Discharge Diagnoses:  Active Problems:   DVT (deep venous thrombosis)   Discharged Condition: Improved  .  Hospital Course: Pt hospitalized in 04/08/2015 for RLE DVT. Little response to anticoagulation and was deemed candidate for transcatheter thrombolysis. This procedure ensued 8/23-24. Pt did well and was discharged on Lovenox 150 mg BID. Was seen in OP IR clinic follow up 04/24/15 and noted recurrent RLE DVT per doppler. Scheduled then for repeat RLE venogram with transcatheter thrombolysis again on 9/12-13/16. Has done well this hospitalization.  Rt leg feels better to pt; less pain; less stiff; able to walk on leg well. Swelling was not an issue this time---no evidence of this noted. I have seen and examined pt Dr Grace Isaac aware of pt status. Eating well; denies N/V Slept well. Ambulating in room  Consults: None  Significant Diagnostic Studies: Rt LE venogram   Treatments: EXAM: 1. LEFT LOWER EXTREMITY VENOGRAM FOLLOWING 24 HOURS OF CATHETER DIRECTED LYTIC INFUSION 2. MECHANICAL THROMBECTOMY WITH ANGIOJET DEVICE 3. BALLOON ANGIOPLASTY OF THE RIGHT FEMORAL VEIN  Discharge Exam: Blood pressure 126/62, pulse 78, temperature 98.4 F (36.9 C), temperature source Oral, resp. rate 16, height 5\' 4"  (1.626 m), weight 281 lb 15.5 oz (127.9 kg), last menstrual period 04/07/2015, SpO2 96 %.  PE:  A/O Appropriate Haert: RRR Lungs: CTA Abd: soft; +BS; NT Extr: FROM Good strength = Good sensation = Rt foot 2+ pulses Rt ankle site : Nt no bleeding; no hematoma Rt post knee site: sl tender; no hematoma Noted small amt ecchymosis Ambulating well  Results for orders placed or performed during the hospital encounter of 04/28/15  MRSA PCR Screening  Result Value Ref Range   MRSA by PCR  NEGATIVE NEGATIVE  Basic metabolic panel  Result Value Ref Range   Sodium 136 135 - 145 mmol/L   Potassium 3.9 3.5 - 5.1 mmol/L   Chloride 104 101 - 111 mmol/L   CO2 24 22 - 32 mmol/L   Glucose, Bld 94 65 - 99 mg/dL   BUN 13 6 - 20 mg/dL   Creatinine, Ser 4.09 0.44 - 1.00 mg/dL   Calcium 9.0 8.9 - 81.1 mg/dL   GFR calc non Af Amer >60 >60 mL/min   GFR calc Af Amer >60 >60 mL/min   Anion gap 8 5 - 15  CBC with Differential/Platelet  Result Value Ref Range   WBC 7.2 4.0 - 10.5 K/uL   RBC 4.12 3.87 - 5.11 MIL/uL   Hemoglobin 10.9 (L) 12.0 - 15.0 g/dL   HCT 91.4 (L) 78.2 - 95.6 %   MCV 86.9 78.0 - 100.0 fL   MCH 26.5 26.0 - 34.0 pg   MCHC 30.4 30.0 - 36.0 g/dL   RDW 21.3 08.6 - 57.8 %   Platelets 266 150 - 400 K/uL   Neutrophils Relative % 56 43 - 77 %   Neutro Abs 4.0 1.7 - 7.7 K/uL   Lymphocytes Relative 28 12 - 46 %   Lymphs Abs 2.0 0.7 - 4.0 K/uL   Monocytes Relative 8 3 - 12 %   Monocytes Absolute 0.6 0.1 - 1.0 K/uL   Eosinophils Relative 8 (H) 0 - 5 %   Eosinophils Absolute 0.5 0.0 - 0.7 K/uL   Basophils Relative 0 0 - 1 %   Basophils Absolute 0.0 0.0 - 0.1 K/uL  Fibrinogen  Result Value Ref Range   Fibrinogen 374 204 - 475 mg/dL  hCG, serum, qualitative  Result Value Ref Range   Preg, Serum NEGATIVE NEGATIVE  Protime-INR  Result Value Ref Range   Prothrombin Time 14.4 11.6 - 15.2 seconds   INR 1.10 0.00 - 1.49  Heparin level (unfractionated)  Result Value Ref Range   Heparin Unfractionated 0.57 0.30 - 0.70 IU/mL  Heparin level (unfractionated)  Result Value Ref Range   Heparin Unfractionated 0.43 0.30 - 0.70 IU/mL  Heparin level (unfractionated)  Result Value Ref Range   Heparin Unfractionated 0.68 0.30 - 0.70 IU/mL  CBC  Result Value Ref Range   WBC 7.6 4.0 - 10.5 K/uL   RBC 4.23 3.87 - 5.11 MIL/uL   Hemoglobin 11.3 (L) 12.0 - 15.0 g/dL   HCT 13.0 86.5 - 78.4 %   MCV 86.3 78.0 - 100.0 fL   MCH 26.7 26.0 - 34.0 pg   MCHC 31.0 30.0 - 36.0 g/dL   RDW  69.6 29.5 - 28.4 %   Platelets 240 150 - 400 K/uL  Fibrinogen  Result Value Ref Range   Fibrinogen 396 204 - 475 mg/dL  Fibrinogen  Result Value Ref Range   Fibrinogen 355 204 - 475 mg/dL  Fibrinogen  Result Value Ref Range   Fibrinogen 252 204 - 475 mg/dL  Heparin level (unfractionated)  Result Value Ref Range   Heparin Unfractionated 0.18 (L) 0.30 - 0.70 IU/mL  Heparin level (unfractionated)  Result Value Ref Range   Heparin Unfractionated 0.13 (L) 0.30 - 0.70 IU/mL  Fibrinogen  Result Value Ref Range   Fibrinogen 125 (L) 204 - 475 mg/dL  Basic metabolic panel  Result Value Ref Range   Sodium 134 (L) 135 - 145 mmol/L   Potassium 4.0 3.5 - 5.1 mmol/L   Chloride 102 101 - 111 mmol/L   CO2 25 22 - 32 mmol/L   Glucose, Bld 93 65 - 99 mg/dL   BUN 7 6 - 20 mg/dL   Creatinine, Ser 1.32 0.44 - 1.00 mg/dL   Calcium 8.9 8.9 - 44.0 mg/dL   GFR calc non Af Amer >60 >60 mL/min   GFR calc Af Amer >60 >60 mL/min   Anion gap 7 5 - 15  Fibrinogen  Result Value Ref Range   Fibrinogen 168 (L) 204 - 475 mg/dL  CBC  Result Value Ref Range   WBC 6.1 4.0 - 10.5 K/uL   RBC 4.06 3.87 - 5.11 MIL/uL   Hemoglobin 11.1 (L) 12.0 - 15.0 g/dL   HCT 10.2 (L) 72.5 - 36.6 %   MCV 85.2 78.0 - 100.0 fL   MCH 27.3 26.0 - 34.0 pg   MCHC 32.1 30.0 - 36.0 g/dL   RDW 44.0 34.7 - 42.5 %   Platelets 195 150 - 400 K/uL  Type and screen for Red Blood Exchange  Result Value Ref Range   ABO/RH(D) B POS    Antibody Screen NEG    Sample Expiration 05/01/2015   ABO/Rh  Result Value Ref Range   ABO/RH(D) B POS     Disposition: Rt lower extremity recurrent DVT DC now to home Dr Grace Isaac aware of pt status Continue all home meds Lovenox 150 mg BID Rx Vicodin 5/325 mg #20 To follow up with Dr Grace Isaac 2-4 weeks at clinic to include RLE DVT doppler study Must wear thigh high compression stocking every day (not to sleep) Information on [facility] in [place] given to pt and Rx Must follow  up with  Hematology---pt has appt with Dr Waynetta Sandy in [place] of this week---to gain referral to Hematology She leaves here with good understanding of this plan  Discharge Instructions    Call MD for:  difficulty breathing, headache or visual disturbances    Complete by:  As directed      Call MD for:  extreme fatigue    Complete by:  As directed      Call MD for:  hives    Complete by:  As directed      Call MD for:  persistant dizziness or light-headedness    Complete by:  As directed      Call MD for:  persistant nausea and vomiting    Complete by:  As directed      Call MD for:  redness, tenderness, or signs of infection (pain, swelling, redness, odor or green/yellow discharge around incision site)    Complete by:  As directed      Call MD for:  severe uncontrolled pain    Complete by:  As directed      Call MD for:  temperature >100.4    Complete by:  As directed      Diet - low sodium heart healthy    Complete by:  As directed      Discharge instructions    Complete by:  As directed   Follow up 2 weeks withh Dr Grace Isaac at clinic---she will hear from scheduler with time and date; call [phone] for questions/concerns; continue all home meds; get thigh high compression stocking from [facility] in [place]     Discharge wound care:    Complete by:  As directed   Can shower today; replace bandages with clean band aid at home     Driving Restrictions    Complete by:  As directed   No driving x 2 days     Increase activity slowly    Complete by:  As directed      Lifting restrictions    Complete by:  As directed   No lifting over 10 lbs x 1 week            Medication List    TAKE these medications        buPROPion 150 MG 24 hr tablet  Commonly known as:  WELLBUTRIN XL  Take 150 mg by mouth daily.     enoxaparin 150 MG/ML injection  Commonly known as:  LOVENOX  Inject 0.89 mLs (135 mg total) into the skin every 12 (twelve) hours.     oxyCODONE 5 MG immediate release  tablet  Commonly known as:  Oxy IR/ROXICODONE  Take 1 tablet (5 mg total) by mouth every 3 (three) hours as needed for moderate pain, severe pain or breakthrough pain.     sertraline 100 MG tablet  Commonly known as:  ZOLOFT  Take 100 mg by mouth daily.          Signed: Deeanne Deininger A 04/30/2015, 10:33 AM   I have spent Greater Than 30 Minutes discharging [employer].

## 2015-04-30 NOTE — Progress Notes (Signed)
Pt voided post foley removal in the bathroom. Pt also ambulated to bathroom without difficulty. Discharge instructions given to pt at this time, No questions left unanswered.

## 2015-05-05 ENCOUNTER — Other Ambulatory Visit: Payer: Self-pay | Admitting: Internal Medicine

## 2015-05-05 ENCOUNTER — Other Ambulatory Visit: Payer: Self-pay | Admitting: Interventional Radiology

## 2015-05-05 ENCOUNTER — Other Ambulatory Visit (HOSPITAL_COMMUNITY): Payer: Self-pay | Admitting: Radiology

## 2015-05-05 DIAGNOSIS — I2699 Other pulmonary embolism without acute cor pulmonale: Secondary | ICD-10-CM

## 2015-05-05 DIAGNOSIS — I82409 Acute embolism and thrombosis of unspecified deep veins of unspecified lower extremity: Secondary | ICD-10-CM

## 2015-05-05 DIAGNOSIS — I82401 Acute embolism and thrombosis of unspecified deep veins of right lower extremity: Secondary | ICD-10-CM

## 2015-05-06 ENCOUNTER — Telehealth (HOSPITAL_COMMUNITY): Payer: Self-pay | Admitting: Pharmacist

## 2015-05-06 NOTE — Progress Notes (Addendum)
Spoke with patient on 04/30/2015 to clarify her home dose of Lovenox prior to admission. Originally the medication reconciliation said she was receiving 135 mg BID (1 mg/kg). However while reviewing the discharge orders it showed that PA Turpin ordered 150 mg BID (1.2 mg/kg). I called Carleene Mains who stated that patient told her she was taking 150 mg BID. I called the patient for additional clarification since the dose is outside of usual dosage range, and she stated that the dose she was receiving was indeed 150 mg BID.  Sherron Monday, PharmD Clinical Pharmacy Resident Pager: [phone] 05/06/2015 9:21 AM

## 2015-05-15 ENCOUNTER — Ambulatory Visit
Admission: RE | Admit: 2015-05-15 | Discharge: 2015-05-15 | Disposition: A | Discharge status: Home / Self Care | Payer: Medicaid Other | Source: Ambulatory Visit | Attending: Interventional Radiology | Admitting: Interventional Radiology

## 2015-05-15 ENCOUNTER — Ambulatory Visit
Admission: RE | Admit: 2015-05-15 | Discharge: 2015-05-15 | Disposition: A | Discharge status: Home / Self Care | Payer: Medicaid Other | Source: Ambulatory Visit | Attending: Radiology | Admitting: Radiology

## 2015-05-15 DIAGNOSIS — I2699 Other pulmonary embolism without acute cor pulmonale: Secondary | ICD-10-CM

## 2015-05-15 DIAGNOSIS — I82401 Acute embolism and thrombosis of unspecified deep veins of right lower extremity: Secondary | ICD-10-CM

## 2015-05-15 DIAGNOSIS — I82409 Acute embolism and thrombosis of unspecified deep veins of unspecified lower extremity: Secondary | ICD-10-CM

## 2015-05-15 NOTE — Progress Notes (Signed)
Chief Complaint: I have lower extremity DVT.  Referring Physician(s): Amy Moon  History of Present Illness: Christina Simmons is a 29 y.o. female presenting as a scheduled follow-up today after catheter directed thrombolysis/thrombectomy of right lower extremity DVT performed 04/29/2015. 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 Because of this reason she also received an IVC filter on 04/25/2015 for protection from pulmonary embolism.  At this time she is taking twice a day dosing of weight-based Lovenox, 150 mg subcutaneous. She tells me her last dose is today and she has no further refills. Her current medication was apparently prescribed by hospitalist physician. Her follow-up appointment with hematology in her home town of [place] is not until 2 weeks from now.  As far as her symptoms, she is much improved. She does have ongoing pain and tightness of her calf, which were calf veins that were left untreated during the catheter directed treatment. She has a small skin scar though no concerns for infection area she is able to ambulate and tolerate all of her activities of daily living.  She is wearing knee-high stockings 20-30 mmHg which are comfortable and give her very good relief from her symptoms.  Past Medical History  Diagnosis Date  . Obesity   . GERD (gastroesophageal reflux disease)   . Depression with anxiety   . Bilateral pulmonary embolism   . DVT (deep venous thrombosis) August 2016    RLE    No past surgical history on file.  Allergies: Amoxicillin  Medications: Prior to Admission medications   Medication Sig Start Date End Date Taking? Authorizing Provider  buPROPion (WELLBUTRIN XL) 300 MG 24 hr tablet Take 300 mg by mouth daily.   Yes Historical Provider, MD  enoxaparin (LOVENOX) 150 MG/ML injection Inject 150 mg into the skin every 12 (twelve) hours.   Yes Historical Provider, MD    phentermine (ADIPEX-P) 37.5 MG tablet Take 37.5 mg by mouth daily before breakfast.   Yes Historical Provider, MD  sertraline (ZOLOFT) 100 MG tablet Take 100 mg by mouth daily.   Yes Historical Provider, MD  buPROPion (WELLBUTRIN XL) 150 MG 24 hr tablet Take 150 mg by mouth daily.    Historical Provider, MD  oxyCODONE (OXY IR/ROXICODONE) 5 MG immediate release tablet Take 1 tablet (5 mg total) by mouth every 3 (three) hours as needed for moderate pain, severe pain or breakthrough pain. Patient not taking: Reported on 05/15/2015 04/12/15   Theodis Blaze, MD     No family history on file.  Social History   Social History  . Marital Status: Married    Spouse Name: N/A  . Number of Children: N/A  . Years of Education: N/A   Social History Main Topics  . Smoking status: Never Smoker   . Smokeless tobacco: Not on file  . Alcohol Use: No  . Drug Use: No  . Sexual Activity: Yes    Birth Control/ Protection: Pill   Other Topics Concern  . Not on file   Social History Narrative       Review of Systems: A 12 point ROS discussed and pertinent positives are indicated in the HPI above.  All other systems are negative.  Review of Systems  Vital Signs: BP 147/75 mmHg  Pulse 108  Temp(Src) 98.2 F (36.8 C) (Oral)  Resp 14  SpO2 100%  LMP 05/08/2015 (Approximate)  Physical Exam  Mallampati Score:  Imaging: Ir Veno/ext/uni Right  04/28/2015   INDICATION: Patient with history of right lower extremity DVT, post technically successful percutaneous thrombolysis completed on 04/09/2015.  The patient is seen in consultation by my Interventional Radiology partner, Dr. 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  As such, patient returns today for repeat attempted percutaneous right lower extremity thrombolysis.  EXAM: 1. ULTRASOUND FLUOROSCOPIC GUIDED RIGHT UPPER  EXTREMITY PICC LINE PLACEMENT 2. RIGHT LOWER EXTREMITY VENOGRAM AND PLACEMENT OF INFUSION CATHETER 3. ULTRASOUND GUIDANCE FOR VASCULAR ACCESS  COMPARISON:  Ultrasound fluoroscopic guided IVC filter placement -04/25/2015; ultrasound and fluoroscopic guided initiation of right lower extremity thrombolysis - 04/08/2015; fluoroscopic guided completion of right lower extremity thrombolysis - 04/09/2015; CT abdomen pelvis - 04/07/2015  MEDICATIONS: None  CONTRAST:  54m OMNIPAQUE IOHEXOL 300 MG/ML  SOLN  ANESTHESIA/SEDATION: Fentanyl 75 mcg IV; Versed 1.5 mg IV  Total Moderate Sedation Time  50 minutes  FLUOROSCOPY TIME:  7 minutes 30 seconds (2326mGy)  COMPLICATIONS: None immediate  TECHNIQUE: Informed written consent was obtained from the patient after a discussion of the risks, benefits and alternatives to treatment. Questions regarding the procedure were encouraged and answered. A timeout was performed prior to the initiation of the procedure.  The right upper extremity was prepped with chlorhexidine in a sterile fashion, and a sterile drape was applied covering the operative field. Maximum barrier sterile technique with sterile gowns and gloves were used for the procedure. A timeout was performed prior to the initiation of the procedure. Local anesthesia was provided with 1% lidocaine.  Under direct ultrasound guidance, the right brachial vein was accessed with a micropuncture kit after the overlying soft tissues were anesthetized with 1% lidocaine. An ultrasound image was saved for documentation purposes. A guidewire was advanced to the level of the superior caval-atrial junction for measurement purposes and the PICC line was cut to length. A peel-away sheath was placed and a 42 cm, 5 [place] dual lumen was inserted to level of the superior caval-atrial junction. A post procedure spot fluoroscopic was obtained. The catheter easily aspirated and flushed and was sutured in place. A dressing was placed.  Attention was  now paid towards the initiation of the right lower extremity thrombolysis.  The scan posterior to the right knee as well as about the medial aspect of the right ankle were prepped and draped in the usual sterile fashion. Maximum barrier sterile technique with sterile gowns and gloves were used for the procedure.  Initially, attempts were made to access one of the widely patent duplicated right posterior tibial veins for the purposes of obtaining a right calf venogram, however the right popliteal vein could not be successfully cannulated due to small size and best vaso spasm.  As such, under direct ultrasound guidance, the caudal aspect of the right popliteal vein was access with a micropuncture kit after the overlying soft tissues were anesthetized with 1% lidocaine. An ultrasound image was saved for documentation purposed. This allowed for placement of a 6-French vascular sheath. A limited venogram was performed through the side arm of the vascular sheath.  With the use of a regular glidewire, a Kumpe catheter was advanced through the left femoral, external iliac vein and through the left common iliac vein to the level of the IVC. Limited venograms were performed in multiple stations. An inferior venacavagram was performed.  The Kumpe catheter was utilized measuring purposes and over a  Bentson wire, a 90 cm/40-cm infusion catheter was advanced with side ports extending from the tip of the vascular sheath to the central aspect of the right common femoral vein.  The vascular sheath was sutured in place with an interrupted suture. A dressing was placed. The patient tolerated the procedure well without immediate postprocedural complication. The infusion catheter was connected to [facility] and the infusion was initiated.  FINDINGS: After successful ultrasound and fluoroscopic guided placement, the tip of the right brachial vein approach PICC line terminates within the superior aspect the right atrium.  The duplicated right  posterior tibial veins appear widely patent however could not be cannulated due to small size and vasospasm.  Initial ultrasound scanning demonstrates the right popliteal vein is enlarged and filled with echogenic noncompressible thrombus. These findings were confirmed with subsequent venogram demonstrating occlusive DVT extending through the popliteal, femoral and peripheral aspect of the right common femoral veins.  The right external and common iliac veins are widely patent. The IVC is widely patent to the level of the right atrium. There is no evidence of thrombus within the recently placed IVC filter.  The side ports of the infusion catheters extend from the right common femoral vein to the tip of the right popliteal vein approach vascular sheath.  IMPRESSION: 1. Technically successful initiation of catheter directed thrombolysis for residual/recurrent right lower extremity DVT extending from the peripheral aspect of the right common femoral vein through the right below-knee popliteal vein. 2. Right brachial vein approach dual-lumen PICC line tip terminating within the superior aspect the right atrium. The PICC line is ready for immediate use.  PLAN: The patient will be transferred to ICU for overnight TNKase infusion and will return tomorrow morning for repeat venogram and intervention.   Electronically Signed   By: Sandi Mariscal M.D.   On: 04/28/2015 11:20   Ir Venocavagram Ivc  04/28/2015   INDICATION: Patient with history of right lower extremity DVT, post technically successful percutaneous thrombolysis completed on 04/09/2015.  The patient is seen in consultation by my Interventional Radiology partner, Dr. 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  As such, patient returns today for repeat attempted percutaneous right lower extremity thrombolysis.  EXAM: 1. ULTRASOUND FLUOROSCOPIC  GUIDED RIGHT UPPER EXTREMITY PICC LINE PLACEMENT 2. RIGHT LOWER EXTREMITY VENOGRAM AND PLACEMENT OF INFUSION CATHETER 3. ULTRASOUND GUIDANCE FOR VASCULAR ACCESS  COMPARISON:  Ultrasound fluoroscopic guided IVC filter placement -04/25/2015; ultrasound and fluoroscopic guided initiation of right lower extremity thrombolysis - 04/08/2015; fluoroscopic guided completion of right lower extremity thrombolysis - 04/09/2015; CT abdomen pelvis - 04/07/2015  MEDICATIONS: None  CONTRAST:  8m OMNIPAQUE IOHEXOL 300 MG/ML  SOLN  ANESTHESIA/SEDATION: Fentanyl 75 mcg IV; Versed 1.5 mg IV  Total Moderate Sedation Time  50 minutes  FLUOROSCOPY TIME:  7 minutes 30 seconds (2563mGy)  COMPLICATIONS: None immediate  TECHNIQUE: Informed written consent was obtained from the patient after a discussion of the risks, benefits and alternatives to treatment. Questions regarding the procedure were encouraged and answered. A timeout was performed prior to the initiation of the procedure.  The right upper extremity was prepped with chlorhexidine in a sterile fashion, and a sterile drape was applied covering the operative field. Maximum barrier sterile technique with sterile gowns and gloves were used for the procedure. A timeout was performed prior to the initiation of the procedure. Local anesthesia was provided with 1% lidocaine.  Under direct ultrasound guidance, the right brachial vein was accessed with a micropuncture kit after the overlying soft tissues were anesthetized with 1% lidocaine. An ultrasound image was saved for documentation purposes. A guidewire was advanced to the level of the superior caval-atrial junction for measurement purposes and the PICC line was cut to length. A peel-away sheath was placed and a 42 cm, 5 [place], dual lumen was inserted to level of the superior caval-atrial junction. A post procedure spot fluoroscopic was obtained. The catheter easily aspirated and flushed and was sutured in place. A dressing was  placed.  Attention was now paid towards the initiation of the right lower extremity thrombolysis.  The scan posterior to the right knee as well as about the medial aspect of the right ankle were prepped and draped in the usual sterile fashion. Maximum barrier sterile technique with sterile gowns and gloves were used for the procedure.  Initially, attempts were made to access one of the widely patent duplicated right posterior tibial veins for the purposes of obtaining a right calf venogram, however the right popliteal vein could not be successfully cannulated due to small size and best vaso spasm.  As such, under direct ultrasound guidance, the caudal aspect of the right popliteal vein was access with a micropuncture kit after the overlying soft tissues were anesthetized with 1% lidocaine. An ultrasound image was saved for documentation purposed. This allowed for placement of a 6-French vascular sheath. A limited venogram was performed through the side arm of the vascular sheath.  With the use of a regular glidewire, a Kumpe catheter was advanced through the left femoral, external iliac vein and through the left common iliac vein to the level of the IVC. Limited venograms were performed in multiple stations. An inferior venacavagram was performed.  The Kumpe catheter was utilized measuring purposes and over a Bentson wire, a 90 cm/40-cm infusion catheter was advanced with side ports extending from the tip of the vascular sheath to the central aspect of the right common femoral vein.  The vascular sheath was sutured in place with an interrupted suture. A dressing was placed. The patient tolerated the procedure well without immediate postprocedural complication. The infusion catheter was connected to [facility] and the infusion was initiated.  FINDINGS: After successful ultrasound and fluoroscopic guided placement, the tip of the right brachial vein approach PICC line terminates within the superior aspect the right  atrium.  The duplicated right posterior tibial veins appear widely patent however could not be cannulated due to small size and vasospasm.  Initial ultrasound scanning demonstrates the right popliteal vein is enlarged and filled with echogenic noncompressible thrombus. These findings were confirmed with subsequent venogram demonstrating occlusive DVT extending through the popliteal, femoral and peripheral aspect of the right common femoral veins.  The right external and common iliac veins are widely patent. The IVC is widely patent to the level of the right atrium. There is no evidence of thrombus within the recently placed IVC filter.  The side ports of the infusion catheters extend from the right common femoral vein to the tip of the right popliteal vein approach vascular sheath.  IMPRESSION: 1. Technically successful initiation of catheter directed thrombolysis for residual/recurrent right lower extremity DVT extending from the peripheral aspect of the right common femoral vein through the right below-knee popliteal vein. 2. Right brachial vein approach dual-lumen PICC line tip terminating within the superior aspect the right atrium. The PICC line is ready for immediate use.  PLAN: The patient will be  transferred to ICU for overnight TNKase infusion and will return tomorrow morning for repeat venogram and intervention.   Electronically Signed   By: Sandi Mariscal M.D.   On: 04/28/2015 11:20   US Venous Img Lower Unilateral Right  05/15/2015   CLINICAL DATA:  29 year old female with a history of thrombophilia and recurrent right-sided DVT despite anti coagulation.  She is status post right lower extremity thrombolysis/thrombectomy 04/29/2015.  She is currently on Lovenox therapy.  EXAM: RIGHT LOWER EXTREMITY VENOUS DOPPLER ULTRASOUND  TECHNIQUE: Gray-scale sonography with graded compression, as well as color Doppler and duplex ultrasound were performed to evaluate the lower extremity deep venous systems from the  level of the common femoral vein and including the common femoral, femoral, profunda femoral, popliteal and calf veins including the posterior tibial, peroneal and gastrocnemius veins when visible. The superficial great saphenous vein was also interrogated. Spectral Doppler was utilized to evaluate flow at rest and with distal augmentation maneuvers in the common femoral, femoral and popliteal veins.  COMPARISON:  None.  FINDINGS: Contralateral Common Femoral Vein: Respiratory phasicity is normal and symmetric with the symptomatic side. No evidence of thrombus. Normal compressibility.  Common Femoral Vein: No evidence of thrombus. Normal compressibility, respiratory phasicity and response to augmentation.  Saphenofemoral Junction: No evidence of thrombus. Normal compressibility and flow on color Doppler imaging.  Profunda Femoral Vein: Chronic recannulized thrombus, with maintained flow and respiratory phasicity.  Femoral Vein: Chronic recannulized thrombus with maintained flow and phasicity  Popliteal Vein: Chronic recannulized thrombus with maintained flow and phasicity  Calf Veins: Posterior tibial vein demonstrates chronic thrombus with maintained flow.  Other Findings:  None.  IMPRESSION: Sonographic survey of the right lower extremity demonstrates no evidence of occlusive DVT. There has been returned flow compared to prior duplex 04/24/2015, status post catheter directed thrombectomy/ thrombolysis 04/29/2015.  Chronic recannulized thrombus through the length of the right femoral vein, popliteal vein, and into the posterior tibial vein.  Signed,  Dulcy Fanny. Earleen Newport, DO  [facility]  [facility]   Electronically Signed   By: Corrie Mckusick D.O.   On: 05/15/2015 16:37   US Venous Img Lower Unilateral Right  04/24/2015   CLINICAL DATA:  29 year old female with a history of right lower extremity DVT, diagnosed April 08, 2015. She underwent catheter directed thrombo lie  cysts 04/08/2015 and 04/09/2015 completion.  She was discharged from the hospital on weight based Lovenox therapy and returns today for evaluation.  Initial symptoms improved, and have again worsened in the right leg over the past few days.  EXAM: RIGHT LOWER EXTREMITY VENOUS DOPPLER ULTRASOUND  TECHNIQUE: Gray-scale sonography with graded compression, as well as color Doppler and duplex ultrasound were performed to evaluate the lower extremity deep venous systems from the level of the common femoral vein and including the common femoral, femoral, profunda femoral, popliteal and calf veins including the posterior tibial, peroneal and gastrocnemius veins when visible. The superficial great saphenous vein was also interrogated. Spectral Doppler was utilized to evaluate flow at rest and with distal augmentation maneuvers in the common femoral, femoral and popliteal veins.  COMPARISON:  Angiographic study 04/08/2015 and 04/09/2015  FINDINGS: Contralateral Common Femoral Vein: Respiratory phasicity is normal and symmetric with the symptomatic side. No evidence of thrombus. Normal compressibility.  Common Femoral Vein: Partial compressibility of the proximal right common femoral vein though flow is maintained. Respiratory phasicity is maintained of the proximal common femoral vein, as well as augmentation with Valsalva. There is clear  evidence of thrombus within the distal common femoral vein extending through the femoral vein into the popliteal vein  Saphenofemoral Junction: No evidence of thrombus. Normal compressibility and flow on color Doppler imaging.  Profunda Femoral Vein: Occlusive thrombus involves the profunda vein.  Femoral Vein: Occlusive thrombus through the length of the femoral vein.  Popliteal Vein: Occlusive thrombus of the popliteal vein.  Superficial Great Saphenous Vein: No evidence of thrombus. Normal compressibility and flow on color Doppler imaging.  Other Findings:  None.  IMPRESSION: Right lower  extremity duplex survey is positive for DVT involving the common femoral vein, femoral vein, profunda vein, and popliteal vein.  Signed,  Dulcy Fanny. Earleen Newport, DO  [facility]  [facility]   Electronically Signed   By: Corrie Mckusick D.O.   On: 04/24/2015 13:59   Ir Fluoro Guide Cv Line Right  04/28/2015   INDICATION: Patient with history of right lower extremity DVT, post technically successful percutaneous thrombolysis completed on 04/09/2015.  The patient is seen in consultation by my Interventional Radiology partner, Dr. 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  As such, patient returns today for repeat attempted percutaneous right lower extremity thrombolysis.  EXAM: 1. ULTRASOUND FLUOROSCOPIC GUIDED RIGHT UPPER EXTREMITY PICC LINE PLACEMENT 2. RIGHT LOWER EXTREMITY VENOGRAM AND PLACEMENT OF INFUSION CATHETER 3. ULTRASOUND GUIDANCE FOR VASCULAR ACCESS  COMPARISON:  Ultrasound fluoroscopic guided IVC filter placement -04/25/2015; ultrasound and fluoroscopic guided initiation of right lower extremity thrombolysis - 04/08/2015; fluoroscopic guided completion of right lower extremity thrombolysis - 04/09/2015; CT abdomen pelvis - 04/07/2015  MEDICATIONS: None  CONTRAST:  92m OMNIPAQUE IOHEXOL 300 MG/ML  SOLN  ANESTHESIA/SEDATION: Fentanyl 75 mcg IV; Versed 1.5 mg IV  Total Moderate Sedation Time  50 minutes  FLUOROSCOPY TIME:  7 minutes 30 seconds (2149mGy)  COMPLICATIONS: None immediate  TECHNIQUE: Informed written consent was obtained from the patient after a discussion of the risks, benefits and alternatives to treatment. Questions regarding the procedure were encouraged and answered. A timeout was performed prior to the initiation of the procedure.  The right upper extremity was prepped with chlorhexidine in a sterile fashion, and a sterile  drape was applied covering the operative field. Maximum barrier sterile technique with sterile gowns and gloves were used for the procedure. A timeout was performed prior to the initiation of the procedure. Local anesthesia was provided with 1% lidocaine.  Under direct ultrasound guidance, the right brachial vein was accessed with a micropuncture kit after the overlying soft tissues were anesthetized with 1% lidocaine. An ultrasound image was saved for documentation purposes. A guidewire was advanced to the level of the superior caval-atrial junction for measurement purposes and the PICC line was cut to length. A peel-away sheath was placed and a 42 cm, 5 [place] dual lumen was inserted to level of the superior caval-atrial junction. A post procedure spot fluoroscopic was obtained. The catheter easily aspirated and flushed and was sutured in place. A dressing was placed.  Attention was now paid towards the initiation of the right lower extremity thrombolysis.  The scan posterior to the right knee as well as about the medial aspect of the right ankle were prepped and draped in the usual sterile fashion. Maximum barrier sterile technique with sterile gowns and gloves were used for the procedure.  Initially, attempts were made to access one of the widely patent duplicated right posterior tibial  veins for the purposes of obtaining a right calf venogram, however the right popliteal vein could not be successfully cannulated due to small size and best vaso spasm.  As such, under direct ultrasound guidance, the caudal aspect of the right popliteal vein was access with a micropuncture kit after the overlying soft tissues were anesthetized with 1% lidocaine. An ultrasound image was saved for documentation purposed. This allowed for placement of a 6-French vascular sheath. A limited venogram was performed through the side arm of the vascular sheath.  With the use of a regular glidewire, a Kumpe catheter was advanced through  the left femoral, external iliac vein and through the left common iliac vein to the level of the IVC. Limited venograms were performed in multiple stations. An inferior venacavagram was performed.  The Kumpe catheter was utilized measuring purposes and over a Bentson wire, a 90 cm/40-cm infusion catheter was advanced with side ports extending from the tip of the vascular sheath to the central aspect of the right common femoral vein.  The vascular sheath was sutured in place with an interrupted suture. A dressing was placed. The patient tolerated the procedure well without immediate postprocedural complication. The infusion catheter was connected to [facility] and the infusion was initiated.  FINDINGS: After successful ultrasound and fluoroscopic guided placement, the tip of the right brachial vein approach PICC line terminates within the superior aspect the right atrium.  The duplicated right posterior tibial veins appear widely patent however could not be cannulated due to small size and vasospasm.  Initial ultrasound scanning demonstrates the right popliteal vein is enlarged and filled with echogenic noncompressible thrombus. These findings were confirmed with subsequent venogram demonstrating occlusive DVT extending through the popliteal, femoral and peripheral aspect of the right common femoral veins.  The right external and common iliac veins are widely patent. The IVC is widely patent to the level of the right atrium. There is no evidence of thrombus within the recently placed IVC filter.  The side ports of the infusion catheters extend from the right common femoral vein to the tip of the right popliteal vein approach vascular sheath.  IMPRESSION: 1. Technically successful initiation of catheter directed thrombolysis for residual/recurrent right lower extremity DVT extending from the peripheral aspect of the right common femoral vein through the right below-knee popliteal vein. 2. Right brachial vein approach  dual-lumen PICC line tip terminating within the superior aspect the right atrium. The PICC line is ready for immediate use.  PLAN: The patient will be transferred to ICU for overnight TNKase infusion and will return tomorrow morning for repeat venogram and intervention.   Electronically Signed   By: Sandi Mariscal M.D.   On: 04/28/2015 11:20   Ir US Guide Vasc Access Right  04/28/2015   INDICATION: Patient with history of right lower extremity DVT, post technically successful percutaneous thrombolysis completed on 04/09/2015.  The patient is seen in consultation by my Interventional Radiology partner, Dr. 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  As such, patient returns today for repeat attempted percutaneous right lower extremity thrombolysis.  EXAM: 1. ULTRASOUND FLUOROSCOPIC GUIDED RIGHT UPPER EXTREMITY PICC LINE PLACEMENT 2. RIGHT LOWER EXTREMITY VENOGRAM AND PLACEMENT OF INFUSION CATHETER 3. ULTRASOUND GUIDANCE FOR VASCULAR ACCESS  COMPARISON:  Ultrasound fluoroscopic guided IVC filter placement -04/25/2015; ultrasound and fluoroscopic guided initiation of right lower extremity thrombolysis - 04/08/2015; fluoroscopic guided completion of right lower extremity thrombolysis -  04/09/2015; CT abdomen pelvis - 04/07/2015  MEDICATIONS: None  CONTRAST:  26m OMNIPAQUE IOHEXOL 300 MG/ML  SOLN  ANESTHESIA/SEDATION: Fentanyl 75 mcg IV; Versed 1.5 mg IV  Total Moderate Sedation Time  50 minutes  FLUOROSCOPY TIME:  7 minutes 30 seconds (2532mGy)  COMPLICATIONS: None immediate  TECHNIQUE: Informed written consent was obtained from the patient after a discussion of the risks, benefits and alternatives to treatment. Questions regarding the procedure were encouraged and answered. A timeout was performed prior to the initiation of the procedure.  The right upper extremity was prepped with chlorhexidine in  a sterile fashion, and a sterile drape was applied covering the operative field. Maximum barrier sterile technique with sterile gowns and gloves were used for the procedure. A timeout was performed prior to the initiation of the procedure. Local anesthesia was provided with 1% lidocaine.  Under direct ultrasound guidance, the right brachial vein was accessed with a micropuncture kit after the overlying soft tissues were anesthetized with 1% lidocaine. An ultrasound image was saved for documentation purposes. A guidewire was advanced to the level of the superior caval-atrial junction for measurement purposes and the PICC line was cut to length. A peel-away sheath was placed and a 42 cm, 5 [place] dual lumen was inserted to level of the superior caval-atrial junction. A post procedure spot fluoroscopic was obtained. The catheter easily aspirated and flushed and was sutured in place. A dressing was placed.  Attention was now paid towards the initiation of the right lower extremity thrombolysis.  The scan posterior to the right knee as well as about the medial aspect of the right ankle were prepped and draped in the usual sterile fashion. Maximum barrier sterile technique with sterile gowns and gloves were used for the procedure.  Initially, attempts were made to access one of the widely patent duplicated right posterior tibial veins for the purposes of obtaining a right calf venogram, however the right popliteal vein could not be successfully cannulated due to small size and best vaso spasm.  As such, under direct ultrasound guidance, the caudal aspect of the right popliteal vein was access with a micropuncture kit after the overlying soft tissues were anesthetized with 1% lidocaine. An ultrasound image was saved for documentation purposed. This allowed for placement of a 6-French vascular sheath. A limited venogram was performed through the side arm of the vascular sheath.  With the use of a regular glidewire, a Kumpe  catheter was advanced through the left femoral, external iliac vein and through the left common iliac vein to the level of the IVC. Limited venograms were performed in multiple stations. An inferior venacavagram was performed.  The Kumpe catheter was utilized measuring purposes and over a Bentson wire, a 90 cm/40-cm infusion catheter was advanced with side ports extending from the tip of the vascular sheath to the central aspect of the right common femoral vein.  The vascular sheath was sutured in place with an interrupted suture. A dressing was placed. The patient tolerated the procedure well without immediate postprocedural complication. [facility]  FINDINGS: After successful ultrasound and fluoroscopic guided placement, the tip of the right brachial vein approach PICC line terminates within the superior aspect the right atrium.  The duplicated right posterior tibial veins appear widely patent however could not be cannulated due to small size and vasospasm.  Initial ultrasound scanning demonstrates the right popliteal vein is enlarged and filled with echogenic noncompressible thrombus. These findings  were confirmed with subsequent venogram demonstrating occlusive DVT extending through the popliteal, femoral and peripheral aspect of the right common femoral veins.  The right external and common iliac veins are widely patent. The IVC is widely patent to the level of the right atrium. There is no evidence of thrombus within the recently placed IVC filter.  The side ports of the infusion catheters extend from the right common femoral vein to the tip of the right popliteal vein approach vascular sheath.  IMPRESSION: 1. Technically successful initiation of catheter directed thrombolysis for residual/recurrent right lower extremity DVT extending from the peripheral aspect of the right common femoral vein through the right below-knee popliteal vein. 2.  Right brachial vein approach dual-lumen PICC line tip terminating within the superior aspect the right atrium. The PICC line is ready for immediate use.  PLAN: The patient will be transferred to ICU for overnight TNKase infusion and will return tomorrow morning for repeat venogram and intervention.   Electronically Signed   By: Sandi Mariscal M.D.   On: 04/28/2015 11:20   Ir US Guide Vasc Access Right  04/28/2015   INDICATION: Patient with history of right lower extremity DVT, post technically successful percutaneous thrombolysis completed on 04/09/2015.  The patient is seen in consultation by my Interventional Radiology partner, Dr. 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  As such, patient returns today for repeat attempted percutaneous right lower extremity thrombolysis.  EXAM: 1. ULTRASOUND FLUOROSCOPIC GUIDED RIGHT UPPER EXTREMITY PICC LINE PLACEMENT 2. RIGHT LOWER EXTREMITY VENOGRAM AND PLACEMENT OF INFUSION CATHETER 3. ULTRASOUND GUIDANCE FOR VASCULAR ACCESS  COMPARISON:  Ultrasound fluoroscopic guided IVC filter placement -04/25/2015; ultrasound and fluoroscopic guided initiation of right lower extremity thrombolysis - 04/08/2015; fluoroscopic guided completion of right lower extremity thrombolysis - 04/09/2015; CT abdomen pelvis - 04/07/2015  MEDICATIONS: None  CONTRAST:  47m OMNIPAQUE IOHEXOL 300 MG/ML  SOLN  ANESTHESIA/SEDATION: Fentanyl 75 mcg IV; Versed 1.5 mg IV  Total Moderate Sedation Time  50 minutes  FLUOROSCOPY TIME:  7 minutes 30 seconds (2132mGy)  COMPLICATIONS: None immediate  TECHNIQUE: Informed written consent was obtained from the patient after a discussion of the risks, benefits and alternatives to treatment. Questions regarding the procedure were encouraged and answered. A timeout was performed prior to the initiation of the procedure.  The right upper extremity was  prepped with chlorhexidine in a sterile fashion, and a sterile drape was applied covering the operative field. Maximum barrier sterile technique with sterile gowns and gloves were used for the procedure. A timeout was performed prior to the initiation of the procedure. Local anesthesia was provided with 1% lidocaine.  Under direct ultrasound guidance, the right brachial vein was accessed with a micropuncture kit after the overlying soft tissues were anesthetized with 1% lidocaine. An ultrasound image was saved for documentation purposes. A guidewire was advanced to the level of the superior caval-atrial junction for measurement purposes and the PICC line was cut to length. A peel-away sheath was placed and a 42 cm, 5 [place] dual lumen was inserted to level of the superior caval-atrial junction. A post procedure spot fluoroscopic was obtained. The catheter easily aspirated and flushed and was sutured in place. A dressing was placed.  Attention was now paid towards the initiation of the right lower extremity thrombolysis.  The scan posterior to the right knee as well as about the medial aspect of the right ankle  were prepped and draped in the usual sterile fashion. Maximum barrier sterile technique with sterile gowns and gloves were used for the procedure.  Initially, attempts were made to access one of the widely patent duplicated right posterior tibial veins for the purposes of obtaining a right calf venogram, however the right popliteal vein could not be successfully cannulated due to small size and best vaso spasm.  As such, under direct ultrasound guidance, the caudal aspect of the right popliteal vein was access with a micropuncture kit after the overlying soft tissues were anesthetized with 1% lidocaine. An ultrasound image was saved for documentation purposed. This allowed for placement of a 6-French vascular sheath. A limited venogram was performed through the side arm of the vascular sheath.  With the use  of a regular glidewire, a Kumpe catheter was advanced through the left femoral, external iliac vein and through the left common iliac vein to the level of the IVC. Limited venograms were performed in multiple stations. An inferior venacavagram was performed.  The Kumpe catheter was utilized measuring purposes and over a Bentson wire, a 90 cm/40-cm infusion catheter was advanced with side ports extending from the tip of the vascular sheath to the central aspect of the right common femoral vein.  The vascular sheath was sutured in place with an interrupted suture. A dressing was placed. The patient tolerated the procedure well without immediate postprocedural complication. The infusion catheter was connected to [facility] and the infusion was initiated.  FINDINGS: After successful ultrasound and fluoroscopic guided placement, the tip of the right brachial vein approach PICC line terminates within the superior aspect the right atrium.  The duplicated right posterior tibial veins appear widely patent however could not be cannulated due to small size and vasospasm.  Initial ultrasound scanning demonstrates the right popliteal vein is enlarged and filled with echogenic noncompressible thrombus. These findings were confirmed with subsequent venogram demonstrating occlusive DVT extending through the popliteal, femoral and peripheral aspect of the right common femoral veins.  The right external and common iliac veins are widely patent. The IVC is widely patent to the level of the right atrium. There is no evidence of thrombus within the recently placed IVC filter.  The side ports of the infusion catheters extend from the right common femoral vein to the tip of the right popliteal vein approach vascular sheath.  IMPRESSION: 1. Technically successful initiation of catheter directed thrombolysis for residual/recurrent right lower extremity DVT extending from the peripheral aspect of the right common femoral vein through the right  below-knee popliteal vein. 2. Right brachial vein approach dual-lumen PICC line tip terminating within the superior aspect the right atrium. The PICC line is ready for immediate use.  PLAN: The patient will be transferred to ICU for overnight TNKase infusion and will return tomorrow morning for repeat venogram and intervention.   Electronically Signed   By: Sandi Mariscal M.D.   On: 04/28/2015 11:20   Ir Infusion Thrombol Venous Initial (Christina)  04/28/2015   INDICATION: Patient with history of right lower extremity DVT, post technically successful percutaneous thrombolysis completed on 04/09/2015.  The patient is seen in consultation by my Interventional Radiology partner, Dr. 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  As such, patient returns today for repeat attempted percutaneous right lower extremity thrombolysis.  EXAM: 1. ULTRASOUND FLUOROSCOPIC GUIDED RIGHT UPPER EXTREMITY PICC LINE PLACEMENT 2. RIGHT LOWER EXTREMITY VENOGRAM AND PLACEMENT  OF INFUSION CATHETER 3. ULTRASOUND GUIDANCE FOR VASCULAR ACCESS  COMPARISON:  Ultrasound fluoroscopic guided IVC filter placement -04/25/2015; ultrasound and fluoroscopic guided initiation of right lower extremity thrombolysis - 04/08/2015; fluoroscopic guided completion of right lower extremity thrombolysis - 04/09/2015; CT abdomen pelvis - 04/07/2015  MEDICATIONS: None  CONTRAST:  53m OMNIPAQUE IOHEXOL 300 MG/ML  SOLN  ANESTHESIA/SEDATION: Fentanyl 75 mcg IV; Versed 1.5 mg IV  Total Moderate Sedation Time  50 minutes  FLUOROSCOPY TIME:  7 minutes 30 seconds (2409mGy)  COMPLICATIONS: None immediate  TECHNIQUE: Informed written consent was obtained from the patient after a discussion of the risks, benefits and alternatives to treatment. Questions regarding the procedure were encouraged and answered. A timeout was performed prior to the initiation of the  procedure.  The right upper extremity was prepped with chlorhexidine in a sterile fashion, and a sterile drape was applied covering the operative field. Maximum barrier sterile technique with sterile gowns and gloves were used for the procedure. A timeout was performed prior to the initiation of the procedure. Local anesthesia was provided with 1% lidocaine.  Under direct ultrasound guidance, the right brachial vein was accessed with a micropuncture kit after the overlying soft tissues were anesthetized with 1% lidocaine. An ultrasound image was saved for documentation purposes. A guidewire was advanced to the level of the superior caval-atrial junction for measurement purposes and the PICC line was cut to length. A peel-away sheath was placed and a 42 cm, 5 [place] dual lumen was inserted to level of the superior caval-atrial junction. A post procedure spot fluoroscopic was obtained. The catheter easily aspirated and flushed and was sutured in place. A dressing was placed.  Attention was now paid towards the initiation of the right lower extremity thrombolysis.  The scan posterior to the right knee as well as about the medial aspect of the right ankle were prepped and draped in the usual sterile fashion. Maximum barrier sterile technique with sterile gowns and gloves were used for the procedure.  Initially, attempts were made to access one of the widely patent duplicated right posterior tibial veins for the purposes of obtaining a right calf venogram, however the right popliteal vein could not be successfully cannulated due to small size and best vaso spasm.  As such, under direct ultrasound guidance, the caudal aspect of the right popliteal vein was access with a micropuncture kit after the overlying soft tissues were anesthetized with 1% lidocaine. An ultrasound image was saved for documentation purposed. This allowed for placement of a 6-French vascular sheath. A limited venogram was performed through the side  arm of the vascular sheath.  With the use of a regular glidewire, a Kumpe catheter was advanced through the left femoral, external iliac vein and through the left common iliac vein to the level of the IVC. Limited venograms were performed in multiple stations. An inferior venacavagram was performed.  The Kumpe catheter was utilized measuring purposes and over a Bentson wire, a 90 cm/40-cm infusion catheter was advanced with side ports extending from the tip of the vascular sheath to the central aspect of the right common femoral vein.  The vascular sheath was sutured in place with an interrupted suture. A dressing was placed. The patient tolerated the procedure well without immediate postprocedural complication. [facility]  FINDINGS: After successful ultrasound and fluoroscopic guided placement, the tip of the right brachial vein approach PICC line terminates within the superior aspect the right atrium.  The duplicated right posterior tibial veins appear widely patent however could not be cannulated due to small size and vasospasm.  Initial ultrasound scanning demonstrates the right popliteal vein is enlarged and filled with echogenic noncompressible thrombus. These findings were confirmed with subsequent venogram demonstrating occlusive DVT extending through the popliteal, femoral and peripheral aspect of the right common femoral veins.  The right external and common iliac veins are widely patent. The IVC is widely patent to the level of the right atrium. There is no evidence of thrombus within the recently placed IVC filter.  The side ports of the infusion catheters extend from the right common femoral vein to the tip of the right popliteal vein approach vascular sheath.  IMPRESSION: 1. Technically successful initiation of catheter directed thrombolysis for residual/recurrent right lower extremity DVT extending from the peripheral aspect of the  right common femoral vein through the right below-knee popliteal vein. 2. Right brachial vein approach dual-lumen PICC line tip terminating within the superior aspect the right atrium. The PICC line is ready for immediate use.  PLAN: The patient will be transferred to ICU for overnight TNKase infusion and will return tomorrow morning for repeat venogram and intervention.   Electronically Signed   By: Sandi Mariscal M.D.   On: 04/28/2015 11:20   Ir Jacolyn Reedy F/u Eval Art/ven Final Day ([place]ina)  04/29/2015   INDICATION: History of right lower extremity DVT, post technically successful percutaneous thrombolysis completed on 04/09/2015.  Patient was subsequently discovered to have recurrent right femoral popliteal DVT and as such was initiated on repeat right lower extremity venous lysis procedure on 04/28/2015.  Patient returns today for completion venogram and potential intervention.  EXAM: 1. LEFT LOWER EXTREMITY VENOGRAM FOLLOWING 24 HOURS OF CATHETER DIRECTED LYTIC INFUSION 2. MECHANICAL THROMBECTOMY WITH ANGIOJET DEVICE 3. BALLOON ANGIOPLASTY OF THE RIGHT FEMORAL VEIN  COMPARISON:  Ultrasound fluoroscopic guided initiation of right lower extremity catheter directed thrombolysis - 04/29/2015; ultrasound fluoroscopic guided IVC filter placement - 04/25/2015 ; fluoroscopic guided completion of right lower extremity thrombolysis -04/09/2015  MEDICATIONS: Fentanyl 175 mcg IV; Versed 2.5 mg IV  CONTRAST:  70 cc Omnipaque 300  ANESTHESIA/SEDATION: Total Moderate Sedation Time  29 minutes  FLUOROSCOPY TIME:  8 minutes 12 seconds (595.6 mGy)  COMPLICATIONS: None immediate  TECHNIQUE: The patient was placed prone on the fluoroscopy table and the right popliteal vein approach vascular sheath as well as the surrounding skin were prepped and draped in usual sterile fashion.  Fluoroscopic images were obtained of the existing infusion catheter for documentation purposes. Over an exchange length Rosen wire, the infusion catheter was  exchanged for a Kumpe the catheter and a right lower extremity venogram was performed from the level of the right external iliac vein to the right popliteal vein access site.  With the use of the Kumpe catheter, a stiff Glidewire was advanced the level of the right common iliac vein. Balloon angioplasty was performed at multiple stations with a 10 mm x 4 cm [place]ang balloon near throughout the entirety of the length of the right femoral vein. This was followed by mechanical thrombectomy with the use of an Angiojet device throughout the entire length of the right femoral vein to the right popliteal vein access site. Repeat right lower extremity venograms were performed of the right thigh through the right popliteal vein access sheath.  An additional round of multiple overlapping angioplasties was performed with a 10 mm angioplasty balloon as well as mechanical thrombectomy with the Angiojet device throughout the entirety  of the right femoral vein. Completion right lower extremity venograms were performed.  Images were reviewed and the procedure was terminated. All wires catheters and sheaths were removed from the patient. Hemostasis was achieved at the right popliteal access site with manual compression. A dressing was placed. The patient tolerated the procedure well without immediate postprocedural complication.  FINDINGS: Preprocedural spot fluoroscopic images demonstrate unchanged positioning of the right lower extremity infusion catheter with side-port extending from the right common femoral vein to the right above knee popliteal vein.  Initial venogram demonstrates persistent complete patency of the right external iliac vein, right common iliac vein as well as the caudal aspect of the IVC. The right common femoral vein appears widely patent. There is restored antegrade flow through the right femoral vein, however there is persistent irregular narrowing primarily involving the proximal and mid aspects of the right  femoral vein.  Balloon angioplasty was performed at multiple stations throughout near the entirety of the right femoral vein with a 10 mm angioplasty balloon. Additional, several rounds of mechanical thrombectomy was performed with the use of an Angiojet device.  Completion venograms of the right lower extremity demonstrate restored antegrade flow through the right femoral and above knee popliteal vein with a moderate amount of refractory nonocclusive narrowing/chronic DVT within the proximal and mid aspects of the right femoral vein.  IMPRESSION: 1. Technically successful right lower extremity catheter directed thrombolysis with restoration of antegrade flow through the right femoral and above knee popliteal veins. 2. Moderate amount of residual narrowing/chronic DVT primarily involving the proximal and mid aspects of the right femoral vein refractory to 24 hours of catheter directed thrombolysis, balloon angioplasty and mechanical thrombectomy with the Angiojet device.  PLAN: The patient will continue on a heparin drip and until transitioned to weight based BID Lovenox injections which will be continued for at least the next 30 days. Will attempt to arrange for the patient to have a right thigh high stocking fitted prior to discharge from the hospital. [facility] for discharge in the morning.  The patient will return to the interventional radiology clinic in 2-4 weeks with repeat right lower extremity DVT ultrasound. In the meantime, the patient should follow-up with her primary care doctor regarding referral to hematologist regarding transitioned to a long-term anticoagulant.   Electronically Signed   By: Sandi Mariscal M.D.   On: 04/29/2015 18:19    Labs:  CBC:  Recent Labs  04/12/15 0920 04/28/15 0716 04/28/15 1053 04/29/15 1130  WBC 7.5 7.2 7.6 6.1  HGB 10.0* 10.9* 11.3* 11.1*  HCT 32.5* 35.8* 36.5 34.6*  PLT 337 266 240 195    COAGS:  Recent Labs  04/08/15 1146  04/10/15 1617 04/10/15 2333  04/11/15 0614 04/11/15 1450 04/28/15 0716  INR 1.43  --   --   --   --   --  1.10  APTT 42*  < > 53* 64* 65* 64*  --   < > = values in this interval not displayed.  BMP:  Recent Labs  04/10/15 0442 04/11/15 0614 04/28/15 0716 04/29/15 0600  NA 136 134* 136 134*  K 3.8 3.6 3.9 4.0  CL 102 99* 104 102  CO2 27 25 24 25   GLUCOSE 104* 94 94 93  BUN <5* 8 13 7   CALCIUM 8.3* 8.5* 9.0 8.9  CREATININE 0.63 0.65 0.81 0.74  GFRNONAA >60 >60 >60 >60  GFRAA >60 >60 >60 >60    LIVER FUNCTION TESTS:  Recent Labs  04/08/15 5449 04/09/15  9935 04/10/15 0442 04/11/15 0614  BILITOT 0.4 0.9 0.4 0.3  AST 29 23 18 28   ALT 14 16 16 25   ALKPHOS 84 73 94 96  PROT 7.1 6.2* 6.3* 6.8  ALBUMIN 2.3* 2.0* 2.0* 2.3*    TUMOR MARKERS: No results for input(s): AFPTM, CEA, CA199, CHROMGRNA in the last 8760 hours.  Assessment and Plan:  Christina Simmons presents today as a scheduled follow-up after right lower extremity catheter directed thrombolysis/thrombectomy.   She has recovered well, and has upcoming appointments with hematology in [place], [place]. This appointment is not for at least 2 weeks, and she will run out of her Lovenox prescription after today. I have prescribed an additional 30 days of 150 mg subcutaneous injection Lovenox, every 12 hours for therapeutic dosing. This is the same prescription she was taking until today.  I encouraged her to observe her established hematology appointment in 2 weeks in [facility].  I would recommend a 6 month follow-up duplex surveillance ultrasound.  I recommended that she continue on the current knee-high stockings.  She also has a retrievable IVC filter. If she stabilizes with anticoagulation therapy, this filter is potentially retrievable. If she is deemed to be a candidate always a risk for clot formation on anticoagulation, a permanent filter should be considered and in this case the retrievable filter should most likely be  retrieved and exchanged for a permanent filter.    SignedCorrie Mckusick 05/15/2015, 4:41 PM   I spent a total of    10 Minutes in face to face in clinical consultation, greater than 50% of which was counseling/coordinating care for right lower extremity DVT, SP catheter directed thrombolysis, SP retrievable IVC filter.

## 2015-05-27 DIAGNOSIS — D6851 Activated protein C resistance: Secondary | ICD-10-CM | POA: Diagnosis not present

## 2015-05-27 DIAGNOSIS — I2699 Other pulmonary embolism without acute cor pulmonale: Secondary | ICD-10-CM | POA: Diagnosis not present

## 2015-05-27 DIAGNOSIS — I82401 Acute embolism and thrombosis of unspecified deep veins of right lower extremity: Secondary | ICD-10-CM | POA: Diagnosis not present

## 2015-11-04 ENCOUNTER — Other Ambulatory Visit: Payer: Self-pay | Admitting: Interventional Radiology

## 2015-11-04 DIAGNOSIS — I2699 Other pulmonary embolism without acute cor pulmonale: Secondary | ICD-10-CM

## 2015-11-04 DIAGNOSIS — I82401 Acute embolism and thrombosis of unspecified deep veins of right lower extremity: Secondary | ICD-10-CM

## 2015-11-04 DIAGNOSIS — I82409 Acute embolism and thrombosis of unspecified deep veins of unspecified lower extremity: Secondary | ICD-10-CM

## 2015-12-04 ENCOUNTER — Encounter: Payer: Self-pay | Admitting: Radiology

## 2016-03-12 ENCOUNTER — Encounter (HOSPITAL_COMMUNITY): Payer: Self-pay | Admitting: Obstetrics and Gynecology

## 2016-03-30 DIAGNOSIS — Z86718 Personal history of other venous thrombosis and embolism: Secondary | ICD-10-CM | POA: Diagnosis not present

## 2016-03-30 DIAGNOSIS — Z7901 Long term (current) use of anticoagulants: Secondary | ICD-10-CM | POA: Diagnosis not present

## 2016-03-30 DIAGNOSIS — Z86711 Personal history of pulmonary embolism: Secondary | ICD-10-CM | POA: Diagnosis not present

## 2016-04-06 ENCOUNTER — Encounter (HOSPITAL_COMMUNITY): Payer: Self-pay

## 2016-04-06 ENCOUNTER — Ambulatory Visit (HOSPITAL_COMMUNITY): Payer: Medicaid Other

## 2016-04-06 ENCOUNTER — Encounter (HOSPITAL_COMMUNITY): Payer: Medicaid Other

## 2016-12-26 IMAGING — XA IR INFUSION THROMBOL VENOUS INITIAL (MS)
1 series · 14 of 24 positions shown · non-contrast
Comparison: Ultrasound fluoroscopic guided IVC filter placement
-04/25/2015;

INDICATION: Patient with history of right lower extremity DVT, post technically
successful percutaneous thrombolysis completed on 04/09/2015.
TECHNIQUE: Informed written consent was obtained from the patient after a
discussion of the risks, benefits and alternatives to treatment.
Questions regarding the procedure were encouraged and answered. A
timeout was performed prior to the initiation of the procedure.

[Series 1: run · 14 of 28 slices shown]
[im 1/28]
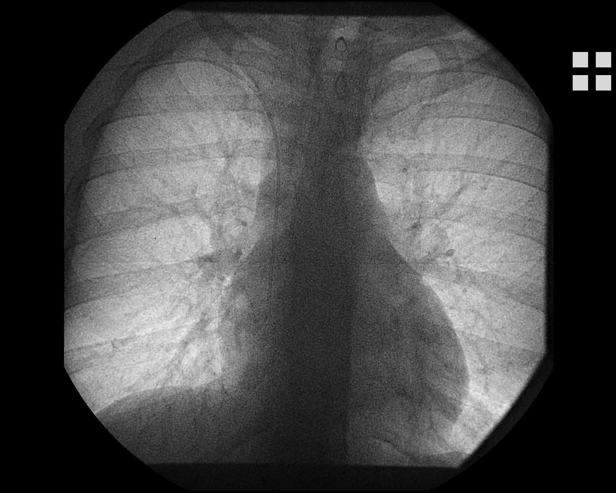
[im 3/28]
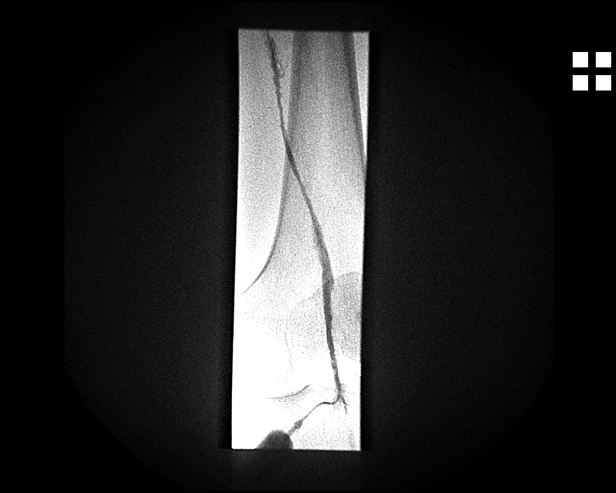
[im 5/28]
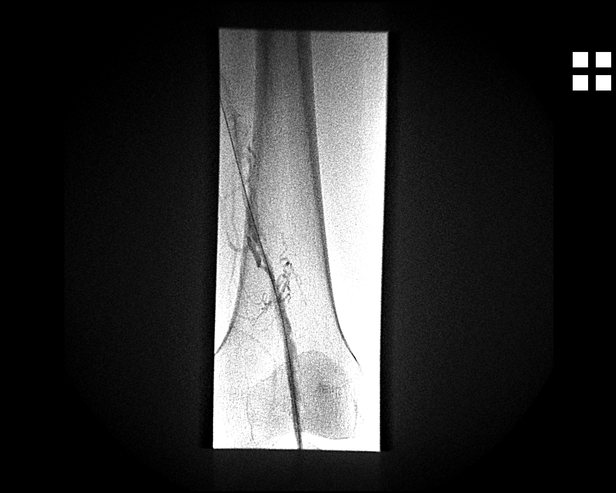
[im 8/28]
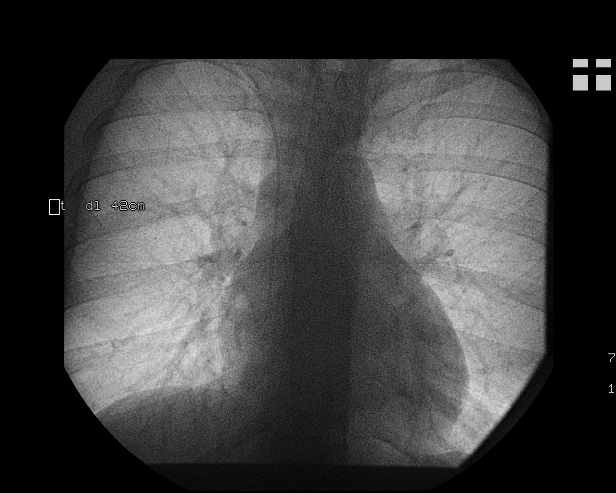
[im 9/28]
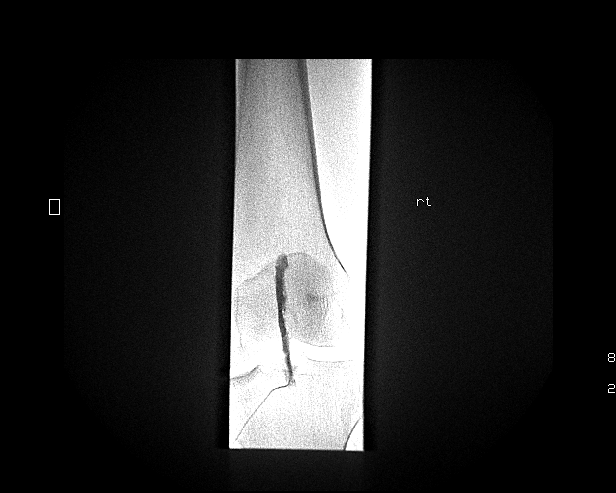
[im 11/28]
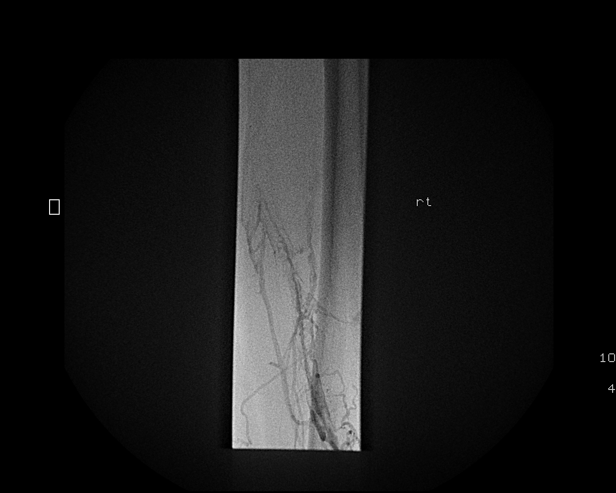
[im 13/28]
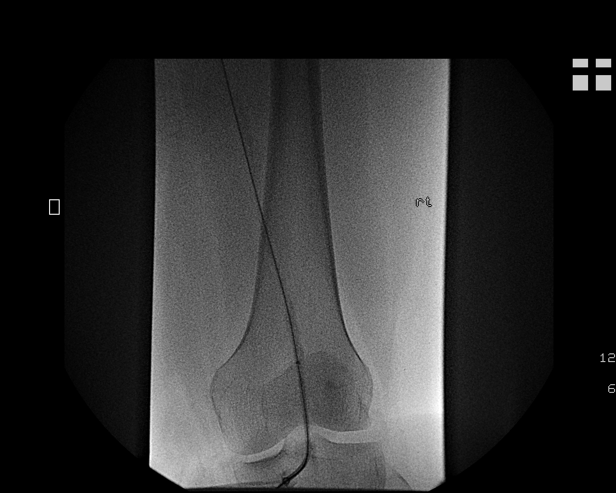
[im 15/28]
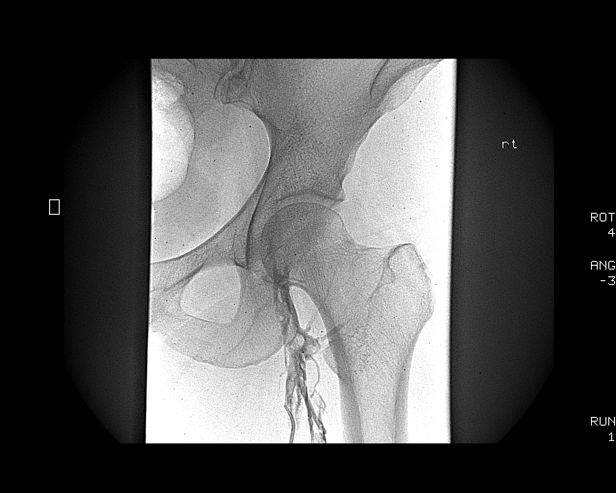
[im 17/28]
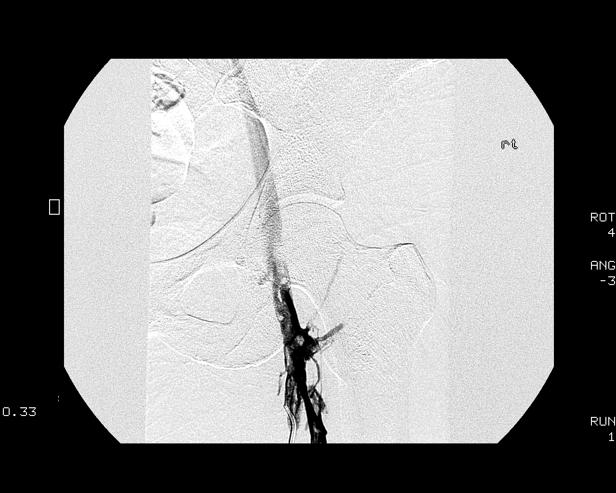
[im 19/28]
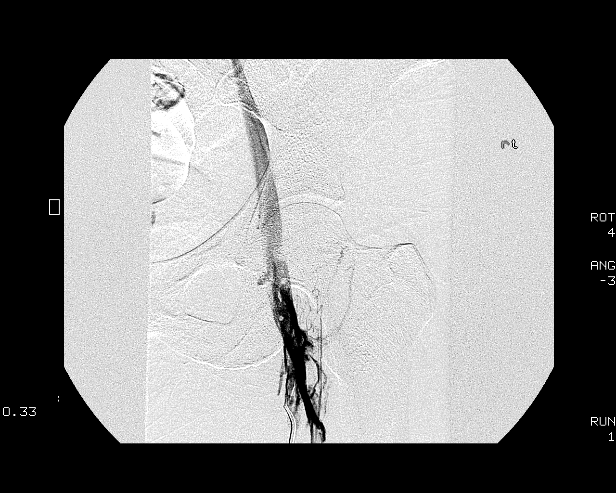
[im 22/28]
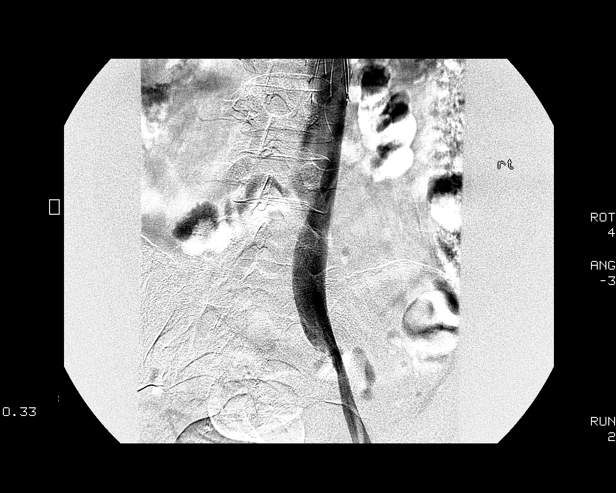
[im 23/28]
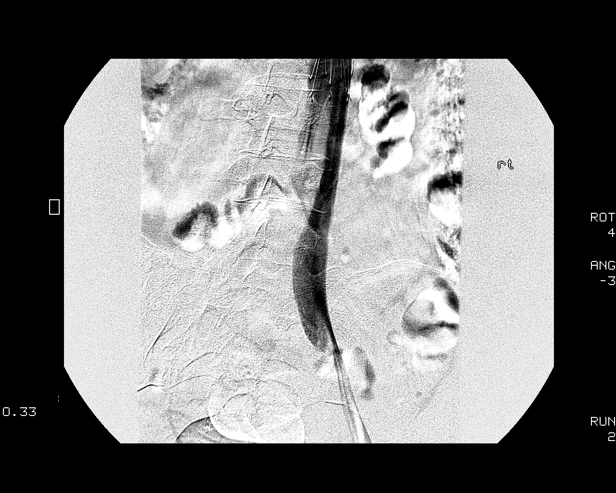
[im 25/28]
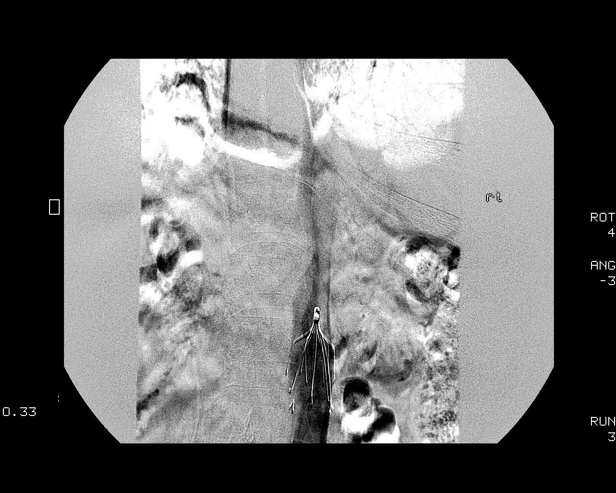
[im 28/28]
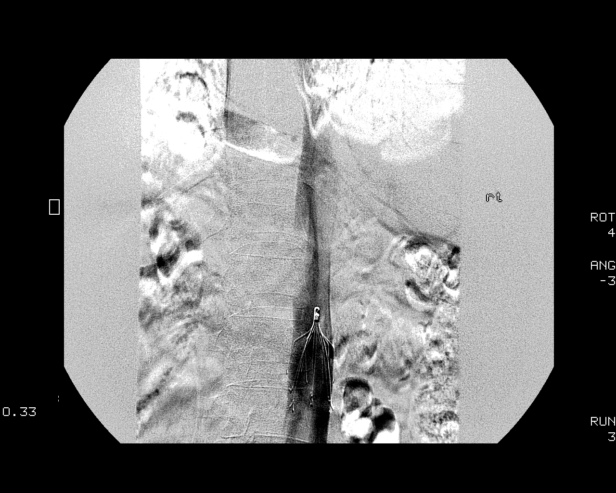

[14 of 24 positions shown; findings below may reference images not displayed]

partner, Dr. Dre, on 04/24/2015 at which time an intra office
ultrasound demonstrated recurrent DVT extending from the right
common femoral through the right popliteal veins.

As such, patient returns today for repeat attempted percutaneous
right lower extremity thrombolysis.

EXAM:
1. ULTRASOUND FLUOROSCOPIC GUIDED RIGHT UPPER EXTREMITY PICC LINE
PLACEMENT
2. RIGHT LOWER EXTREMITY VENOGRAM AND PLACEMENT OF INFUSION CATHETER
3. ULTRASOUND GUIDANCE FOR VASCULAR ACCESS
ultrasound and fluoroscopic guided initiation of right
lower extremity thrombolysis - 04/08/2015; fluoroscopic guided
completion of right lower extremity thrombolysis - 04/09/2015; CT
abdomen pelvis - 04/07/2015

MEDICATIONS:
None

CONTRAST:  60mL OMNIPAQUE IOHEXOL 300 MG/ML  SOLN

ANESTHESIA/SEDATION:
Fentanyl 75 mcg IV; Versed 1.5 mg IV

Total Moderate Sedation Time

50 minutes

FLUOROSCOPY TIME:  7 minutes 30 seconds (221 mGy)

COMPLICATIONS:
None immediate
The right upper extremity was prepped with chlorhexidine in a
sterile fashion, and a sterile drape was applied covering the
operative field. Maximum barrier sterile technique with sterile
gowns and gloves were used for the procedure. A timeout was
performed prior to the initiation of the procedure. Local anesthesia
was provided with 1% lidocaine.

Under direct ultrasound guidance, the right brachial vein was
accessed with a micropuncture kit after the overlying soft tissues
were anesthetized with 1% lidocaine. An ultrasound image was saved
for documentation purposes. A guidewire was advanced to the level of
the superior caval-atrial junction for measurement purposes and the
PICC line was cut to length. A peel-away sheath was placed and a 42
cm, 5 French, dual lumen was inserted to level of the superior
caval-atrial junction. A post procedure spot fluoroscopic was
obtained. The catheter easily aspirated and flushed and was sutured
in place. A dressing was placed.

Attention was now paid towards the initiation of the right lower
extremity thrombolysis.

The scan posterior to the right knee as well as about the medial
aspect of the right ankle were prepped and draped in the usual
sterile fashion. Maximum barrier sterile technique with sterile
gowns and gloves were used for the procedure.

Initially, attempts were made to access one of the widely patent
duplicated right posterior tibial veins for the purposes of
obtaining a right calf venogram, however the right popliteal vein
could not be successfully cannulated due to small size and best vaso
spasm.

As such, under direct ultrasound guidance, the caudal aspect of the
right popliteal vein was access with a micropuncture kit after the
overlying soft tissues were anesthetized with 1% lidocaine. An
ultrasound image was saved for documentation purposed. This allowed
for placement of a 6-French vascular sheath. A limited venogram was
performed through the side arm of the vascular sheath.

With the use of a regular glidewire, a Kumpe catheter was advanced
through the left femoral, external iliac vein and through the left
common iliac vein to the level of the IVC. Limited venograms were
performed in multiple stations. An inferior venacavagram was
performed.

The Kumpe catheter was utilized measuring purposes and over a
Bentson wire, a 90 cm/40-cm infusion catheter was advanced with side
ports extending from the tip of the vascular sheath to the central
aspect of the right common femoral vein.

The vascular sheath was sutured in place with an interrupted suture.
A dressing was placed. The patient tolerated the procedure well
without immediate postprocedural complication. The infusion catheter
was connected to TNKase and the infusion was initiated.
FINDINGS: After successful ultrasound and fluoroscopic guided placement, the
tip of the right brachial vein approach PICC line terminates within
the superior aspect the right atrium.

The duplicated right posterior tibial veins appear widely patent
however could not be cannulated due to small size and vasospasm.

Initial ultrasound scanning demonstrates the right popliteal vein is
enlarged and filled with echogenic noncompressible thrombus. These
findings were confirmed with subsequent venogram demonstrating
occlusive DVT extending through the popliteal, femoral and
peripheral aspect of the right common femoral veins.

The right external and common iliac veins are widely patent. The IVC
is widely patent to the level of the right atrium. There is no
evidence of thrombus within the recently placed IVC filter.

The side ports of the infusion catheters extend from the right
common femoral vein to the tip of the right popliteal vein approach
vascular sheath.
IMPRESSION: 1. Technically successful initiation of catheter directed
thrombolysis for residual/recurrent right lower extremity DVT
extending from the peripheral aspect of the right common femoral
vein through the right below-knee popliteal vein.
2. Right brachial vein approach dual-lumen PICC line tip terminating
within the superior aspect the right atrium. The PICC line is ready
for immediate use.

PLAN:
The patient will be transferred to ICU for overnight TNKase infusion
and will return tomorrow morning for repeat venogram and
intervention.

## 2017-09-26 ENCOUNTER — Encounter (HOSPITAL_COMMUNITY): Payer: Self-pay

## 2017-09-29 ENCOUNTER — Encounter (HOSPITAL_COMMUNITY): Payer: Self-pay

## 2017-09-29 ENCOUNTER — Ambulatory Visit (HOSPITAL_COMMUNITY)
Admission: RE | Admit: 2017-09-29 | Discharge: 2017-09-29 | Disposition: A | Discharge status: Home / Self Care | Payer: BC Managed Care – PPO | Source: Ambulatory Visit | Attending: Obstetrics and Gynecology | Admitting: Obstetrics and Gynecology

## 2017-09-29 DIAGNOSIS — N96 Recurrent pregnancy loss: Secondary | ICD-10-CM | POA: Diagnosis not present

## 2017-09-29 DIAGNOSIS — Z86718 Personal history of other venous thrombosis and embolism: Secondary | ICD-10-CM | POA: Diagnosis not present

## 2017-09-29 DIAGNOSIS — Z86711 Personal history of pulmonary embolism: Secondary | ICD-10-CM | POA: Diagnosis not present

## 2017-09-29 DIAGNOSIS — Z315 Encounter for genetic counseling: Secondary | ICD-10-CM | POA: Insufficient documentation

## 2017-09-29 HISTORY — DX: Hereditary deficiency of other clotting factors: D68.2

## 2017-09-29 LAB — ANTITHROMBIN III: ANTITHROMB III FUNC: 83 % (ref 75–120)

## 2017-09-29 LAB — TSH: TSH: 1.885 u[IU]/mL (ref 0.350–4.500)

## 2017-09-29 LAB — ROUTINE CHROMOSOME - KARYOTYPE

## 2017-09-29 NOTE — Consult Note (Addendum)
MFM consult  32 yr old [id number] referred by Dr. Modesta Messing for consult secondary to history of preeclampsia, recurrent miscarriage, factor V leiden heterozygous, and history of venous thromboembolism.  Past OB hx: 2007- SVD at 36 weeks for preeclampsia 2008- SVD at 32 weeks for preeclampsia 2017- first trimester miscarriage 2019- first trimester miscarriage- had tachycardia and elevated BPs- now resolved  PMH: history of DVT and pulmonary embolism (was on OCPs at the time)- has IVC filter  Medications: aspirin, lovenox, PNV, zoloft Allergies: amoxicillin  Labs: factor V leiden heterozygous (2017) 2017- prothrombin gene, protein C, protein S, and APS work up negative  BP 132/94  I counseled the patient as follows: 1. Recurrent miscarriage: - discussed etiology unclear at this time  - had normal HgbA1C - has not had genetic testing on products of conception or during the pregnancies - recommend repeat APS work up (drawn today) - recommend check TSH (drawn today) - can consider uterine cavity evaluation with HSG or sonohystogram- although discussed given she had 2 prior pregnancies that were almost full term (induced secondary to preeclampsia) a uterine malformation is less likely to be the cause of miscarriage; although can evaluate for uterine fibroids - patient met with the genetic counselor; see separate report- after counseling patient and her husband opted for parental karyotypes which were drawn today - discussed we might not find an "answer" as to why she had 2 miscarriages 2. History of venous thromboembolism and factor V leiden heterozygous: - I discussed that this diagnosis increases her risk of thromboembolic event especially during pregnancy and postpartum- given history of venous thromboembolism the risk of recurrent VTE is 2-10% in pregnancy/postpartum - given this recommend at least intermediate dosing of lovenox or heparin during pregnancy and for at least 6 weeks postpartum  and given previously had recurrent DVT while on lovenox (uncertain if was therapeutic dosing) recommend consider therapeutic lovenox/heparin during pregnancy and for at least 6 weeks postpartum. Patient is currently taking lovenox 22m bid. Patient will discuss with her Hematologist if needs to be on anticoagulation when not pregnant. however when she becomes pregnant would recommend increase or start lovenox at therapeutic dose of 180mkg bid. recommend check anti-Xa 3-4 hours after at least the 3rd dose and adjust lovenox to achieve an anti Xa level of 0.6-1. Once on a therapeutic dose recommend check anti-Xa levels monthly and adjust lovenox as needed. Discussed there is increased risk of bleeding with lovenox/heparin. Risk of serious bleeding is approximately 3%.  - would recommend induction between at [redacted] weeks gestation - with timed holding of lovenox 24 hrs prior to a placement of an epidural - if prolonged induction is possible can admit and put patient on a heparin drip until active labor is achieved. Would recommend at least SCDs during labor. Would then recommend restarting therapeutic dosing of lovenox 6 hours after a vaginal delivery or 12 hours after a C section and continue until at least 6 weeks postpartum.   - discussed factor V leiden mutation may be associated with increased risk of late fetal loss but is not associated with first trimester miscarriage. - recommend repeat APS work up and I do not see that an antithrombin III was done so this was drawn today as well - patient is also heterozygous for MTHFR- discussed this does not increase risk of VTE or adverse pregnancy outcome in the absence of hyperhomocysteinemia  - discussed estrogen containing contraception is contraindicated given risk of VTE  3. History of preeclampsia: - differed father  of baby - discussed increased risk of preeclampsia with future pregnancies which may be as high as 30% - recommend continue low dose aspirin  which has been shown to reduce risk of preeclampsia by 30-40% in high risk women  4. Given the above and obesity recommend serial fetal growth after 24 weeks of pregnancy and consider antenatal testing starting at 32 weeks dependent on BMI  5. Patient had elevated BPs during her most recent pregnancy (1st trimeter) recommend monitor BPs closely. If meets criteria for chronic hypertension recommend treat to maintain BPs <150/100; recommend serial fetal growth in pregnancy and start antenatal testing at 32 weeks  Patient will meet with her Hematologist to discuss plan for IVC filter and if needs anticoagulation at this point. Once pregnant patient will call and can be started on lovenox 38m/kb bid. Platelets should be checked 5-7 days after starting lovenox.  I spent a total of 60 minutes with the patient of which >50% was in face to face consultation.  Please call with questions.  [place] MD

## 2017-09-29 NOTE — ED Notes (Signed)
Pt in for preconception consult and genetic counseling. BP 132/94, pulse 89, weight 303.6lb.

## 2017-09-29 NOTE — Progress Notes (Signed)
Genetic Counseling  Preconception Note  Appointment Date:  09/29/2017 Referred By: Damaris Schooner, DO Date of Birth:  [birth year] Partner:  Christina Simmons   Pregnancy History: [id number]   I met with Mrs. Christina Simmons and her husband, Mr. Christina Simmons, for genetic counseling because of recurrent pregnancy loss.  In summary:  Discussed history of two first trimester pregnancy losses for the couple  Reviewed various etiologies for recurrent pregnancy loss    Discussed options of screening / testing  Peripheral Blood Karyotype analysis- performed today for patient and partner  Expanded carrier screening panel- performed today ([facility])  See separate MFM consult note for detailed discussion regarding patient's medical history and additional RPL workup   We began by reviewing the family history in detail. Christina Simmons has two sons, ages 44 and 56 years old, from a previous partner. Christina Simmons and Christina Simmons have had two first trimester pregnancy losses, at 12 and 10 weeks respectively. Genetic testing was not performed on POCs. Christina Simmons reported that she and her mother are Factor V Leiden heterozygotes; Christina Simmons has a history of PE. See separate MFM consult note from today's visit for detailed discussion of Christina Simmons's medical history. There were no additional relatives reported with recurrent pregnancy loss. Christina Simmons maternal grandmother reportedly had one son die as a neonate due to being a "blue" baby. Christina Simmons also reported that a maternal aunt has a type of overgrowth condition, but the specific condition was unclear today; This aunt's children are reportedly unaffected.   The family histories were otherwise found to be noncontributory for birth defects, intellectual disability, and known genetic conditions. Consanguinity was denied. Without further information regarding the provided family history, an accurate genetic risk cannot be calculated.  Further genetic counseling is warranted if more information is obtained.  We discussed that recurrent pregnancy losses is defined as the occurrence of 2-3+ pregnancy losses.  They were counseled that ~15-20% of all clinically recognized pregnancies result in miscarriage and that 80% occur during the first trimester of pregnancy.  We reviewed that there are multiple etiologies for recurrent pregnancy losses including: maternal disorders (thyroid disease, diabetes, lupus, etc), anatomical differences (incompetent cervix, abnormal uterine position or shape), environmental causes (nutrition, drugs, alcohol, infections), and genetic causes (chromosome differences).  We spent time discussing genetic causes. See separate MFM consult note for additional discussion regarding recurrent pregnancy loss workup.   Regarding genetic causes,   We discussed genes and chromosomes. We discussed that sporadic fetal aneuploidy is known cause of pregnancy loss. While it is not known if this was the cause for either of the couple's previous losses, we discussed that the chance for sporadic fetal aneuploidy due to nondisjunction would be estimated to be related to the patient's age, which gradually increases with time. Given that nondisjunction occurs at conception, they understand that there is not preconception screening available for fetal trisomies. We discussed that chromosome imbalances can also occur in pregnancy when an individual in the couple is a carrier of a chromosome rearrangement, such as a translocation. We discussed that once a couple has had 2-3 or more unexplained pregnancy losses, the risk for an inherited chromosome difference in one of the parents (translocation) is ~5-10%. Peripheral blood chromosome analysis was offered, and the couple both elected to pursue peripheral blood chromosome analysis today.    Regarding single gene conditions, we discussed autosomal dominant, autosomal recessive, and X-linked  conditions. We discussed the option of carrier screening for a panel of  autosomal recessive and some X-linked conditions, some of which could possibly relate to the pregnancy loss, though the majority on the panel would not relate to this history. ACOG currently recommends that all patients be offered carrier screening for cystic fibrosis, spinal muscular atrophy and hemoglobinopathies. In addition, they were counseled that there are a variety of genetic screening laboratories that have pan-ethnic, or expanded, carrier screening panels, which evaluate carrier status for a wide range of genetic conditions. Some of these conditions are severe and actionable, but also rare; others occur more commonly, but are less severe. We discussed that testing options range from screening for a single condition to panels of more than 200 autosomal or X-linked genetic conditions. The prevalence of each condition varies (and often varies with ethnicity). Thus the couples' background risk to be a carrier for each of these various conditions would range, and in some cases be very low or unknown. We reviewed that a negative carrier screen would thus reduce, but not eliminate the chance to be a carrier for these conditions. For some conditions included on specific pan-ethnic carrier screening panels, the phenotype may not yet be well defined. For the majority of conditions on pan-ethnic carrier screening panels, identification of carrier status is not expected to be associated with medical features for the carrier; However, there are currently few exceptions where carrier status has been shown to increase the chance for certain medical concerns. We reviewed that in the event that one partner is found to be a carrier for one or more conditions, carrier screening would be available to the partner for those conditions. We discussed the risks, benefits, and limitations of carrier screening with the couple. After thoughtful consideration of  their options, Christina Simmons elected to proceed with expanded pan-ethnic carrier screening (including ACOG recommended panel) at this time through [facility]) for 175 conditions. Christina Simmons also elected to pursue blood draw for expanded carrier screening, with his sample being held pending results of Christina Simmons.    I counseled this couple regarding the above risks and available options.  The approximate face-to-face time with the genetic counselor was 45 minutes.  Christina Oman, MS Certified Genetic Counselor 09/29/2017

## 2017-09-30 LAB — LUPUS ANTICOAGULANT PANEL
DRVVT: 39.2 s (ref 0.0–47.0)
PTT LA: 32.3 s (ref 0.0–51.9)

## 2017-10-01 LAB — BETA-2-GLYCOPROTEIN I ABS, IGG/M/A
Beta-2 Glyco I IgG: <9 GPI IgG units (ref 0–20)
Beta-2-Glycoprotein I IgA: <9 GPI IgA units (ref 0–25)
Beta-2-Glycoprotein I IgM: <9 GPI IgM units (ref 0–32)

## 2017-10-01 LAB — CARDIOLIPIN ANTIBODIES, IGG, IGM, IGA: Anticardiolipin IgM: <9 MPL U/mL (ref 0–12)

## 2017-10-05 ENCOUNTER — Other Ambulatory Visit: Payer: Self-pay

## 2017-10-11 ENCOUNTER — Other Ambulatory Visit: Payer: Self-pay

## 2017-10-13 ENCOUNTER — Telehealth (HOSPITAL_COMMUNITY): Payer: Self-pay | Admitting: MS"

## 2017-10-13 NOTE — Telephone Encounter (Signed)
Called Ms. Christina Simmons to discuss her expanded carrier screening results. Mrs. Christina Simmons had carrier screening for 176 conditions, including the ACOG recommended conditions (SMA, CF, and hemoglobinopathies) through [facility]. The patient was identified by name and DOB. We reviewed that the results are negative, indicating that she does not have a detectable gene alteration in any of the genes for which analysis was performed. We reviewed that carrier screening does not detect all carriers of these conditions, but a normal result significantly decreases the likelihood of being a carrier, and therefore, the overall reproductive risk. We reviewed that [facility] sequences most of the genes, which is associated with a high detection rate for carriers, thus a negative screen is very reassuring. Carrier screening is not being performed for her husband, given that her results were negative.   Additionally, Mrs. Christina Simmons and her husband, Mr. Christina Simmons had peripheral blood chromosome analysis and results were within normal limits indicating 72, XX chromosomes for Mrs. Christina Simmons and 46,XY chromosomes for Mr. Christina Simmons (MRN: [id number]).   All questions were answered to her satisfaction, she was encouraged to call with additional questions or concerns. ? Chipper Oman, MS Insurance risk surveyor

## 2018-08-14 ENCOUNTER — Encounter (HOSPITAL_COMMUNITY): Payer: Self-pay

## 2018-08-14 ENCOUNTER — Observation Stay (HOSPITAL_COMMUNITY)
Admission: EM | Admit: 2018-08-14 | Discharge: 2018-08-15 | Disposition: A | Discharge status: Home / Self Care | Payer: Medicaid Other | Attending: Family Medicine | Admitting: Family Medicine

## 2018-08-14 ENCOUNTER — Emergency Department (HOSPITAL_COMMUNITY): Payer: Medicaid Other

## 2018-08-14 ENCOUNTER — Other Ambulatory Visit: Payer: Self-pay

## 2018-08-14 DIAGNOSIS — R000 Tachycardia, unspecified: Secondary | ICD-10-CM | POA: Insufficient documentation

## 2018-08-14 DIAGNOSIS — R112 Nausea with vomiting, unspecified: Secondary | ICD-10-CM

## 2018-08-14 DIAGNOSIS — K439 Ventral hernia without obstruction or gangrene: Secondary | ICD-10-CM | POA: Diagnosis not present

## 2018-08-14 DIAGNOSIS — Z86711 Personal history of pulmonary embolism: Secondary | ICD-10-CM | POA: Diagnosis not present

## 2018-08-14 DIAGNOSIS — I82220 Acute embolism and thrombosis of inferior vena cava: Secondary | ICD-10-CM | POA: Diagnosis not present

## 2018-08-14 DIAGNOSIS — I8290 Acute embolism and thrombosis of unspecified vein: Secondary | ICD-10-CM | POA: Diagnosis present

## 2018-08-14 DIAGNOSIS — R42 Dizziness and giddiness: Secondary | ICD-10-CM | POA: Diagnosis not present

## 2018-08-14 DIAGNOSIS — Z86718 Personal history of other venous thrombosis and embolism: Secondary | ICD-10-CM | POA: Diagnosis not present

## 2018-08-14 DIAGNOSIS — I82409 Acute embolism and thrombosis of unspecified deep veins of unspecified lower extremity: Secondary | ICD-10-CM | POA: Diagnosis present

## 2018-08-14 DIAGNOSIS — D72829 Elevated white blood cell count, unspecified: Secondary | ICD-10-CM | POA: Insufficient documentation

## 2018-08-14 DIAGNOSIS — F418 Other specified anxiety disorders: Secondary | ICD-10-CM | POA: Insufficient documentation

## 2018-08-14 DIAGNOSIS — Z880 Allergy status to penicillin: Secondary | ICD-10-CM | POA: Diagnosis not present

## 2018-08-14 DIAGNOSIS — D682 Hereditary deficiency of other clotting factors: Secondary | ICD-10-CM | POA: Diagnosis not present

## 2018-08-14 DIAGNOSIS — Z9114 Patient's other noncompliance with medication regimen: Secondary | ICD-10-CM | POA: Diagnosis not present

## 2018-08-14 DIAGNOSIS — Z6841 Body Mass Index (BMI) 40.0 and over, adult: Secondary | ICD-10-CM | POA: Insufficient documentation

## 2018-08-14 DIAGNOSIS — N200 Calculus of kidney: Secondary | ICD-10-CM | POA: Diagnosis not present

## 2018-08-14 DIAGNOSIS — I82429 Acute embolism and thrombosis of unspecified iliac vein: Secondary | ICD-10-CM | POA: Diagnosis not present

## 2018-08-14 DIAGNOSIS — Z7901 Long term (current) use of anticoagulants: Secondary | ICD-10-CM | POA: Insufficient documentation

## 2018-08-14 DIAGNOSIS — D6851 Activated protein C resistance: Secondary | ICD-10-CM | POA: Insufficient documentation

## 2018-08-14 DIAGNOSIS — Z793 Long term (current) use of hormonal contraceptives: Secondary | ICD-10-CM | POA: Diagnosis not present

## 2018-08-14 DIAGNOSIS — K219 Gastro-esophageal reflux disease without esophagitis: Secondary | ICD-10-CM | POA: Diagnosis not present

## 2018-08-14 DIAGNOSIS — Z79899 Other long term (current) drug therapy: Secondary | ICD-10-CM | POA: Insufficient documentation

## 2018-08-14 LAB — CBC
HCT: 37.6 % (ref 36.0–46.0)
HEMOGLOBIN: 11 g/dL — AB (ref 12.0–15.0)
MCH: 24.2 pg — ABNORMAL LOW (ref 26.0–34.0)
MCHC: 29.3 g/dL — ABNORMAL LOW (ref 30.0–36.0)
MCV: 82.8 fL (ref 80.0–100.0)
NRBC: 0 % (ref 0.0–0.2)
Platelets: 219 10*3/uL (ref 150–400)
RBC: 4.54 MIL/uL (ref 3.87–5.11)
RDW: 15.3 % (ref 11.5–15.5)
WBC: 13.2 10*3/uL — AB (ref 4.0–10.5)

## 2018-08-14 LAB — URINALYSIS, ROUTINE W REFLEX MICROSCOPIC
Bilirubin Urine: NEGATIVE
GLUCOSE, UA: NEGATIVE mg/dL
KETONES UR: NEGATIVE mg/dL
LEUKOCYTES UA: NEGATIVE
NITRITE: NEGATIVE
PROTEIN: 30 mg/dL — AB
Specific Gravity, Urine: 1.021 (ref 1.005–1.030)
pH: 6 (ref 5.0–8.0)

## 2018-08-14 LAB — COMPREHENSIVE METABOLIC PANEL
ALBUMIN: 3.6 g/dL (ref 3.5–5.0)
ALK PHOS: 58 U/L (ref 38–126)
ALT: 13 U/L (ref 0–44)
AST: 16 U/L (ref 15–41)
Anion gap: 12 (ref 5–15)
BILIRUBIN TOTAL: 0.5 mg/dL (ref 0.3–1.2)
BUN: 12 mg/dL (ref 6–20)
CALCIUM: 9.2 mg/dL (ref 8.9–10.3)
CO2: 21 mmol/L — AB (ref 22–32)
Chloride: 103 mmol/L (ref 98–111)
Creatinine, Ser: 0.9 mg/dL (ref 0.44–1.00)
GFR calc Af Amer: >60 mL/min (ref >60)
GFR calc non Af Amer: >60 mL/min (ref >60)
GLUCOSE: 108 mg/dL — AB (ref 70–99)
POTASSIUM: 3.7 mmol/L (ref 3.5–5.1)
SODIUM: 136 mmol/L (ref 135–145)
TOTAL PROTEIN: 7.9 g/dL (ref 6.5–8.1)

## 2018-08-14 LAB — TYPE AND SCREEN
ABO/RH(D): B POS
Antibody Screen: NEGATIVE

## 2018-08-14 LAB — I-STAT BETA HCG BLOOD, ED (MC, WL, AP ONLY): I-stat hCG, quantitative: 5.8 m[IU]/mL — ABNORMAL HIGH (ref <5)

## 2018-08-14 MED ORDER — APIXABAN 5 MG PO TABS: 5.0000 mg | ORAL_TABLET | Freq: Two times a day (BID) | ORAL | Status: DC

## 2018-08-14 MED ORDER — ONDANSETRON HCL 4 MG/2ML IJ SOLN
4.0000 mg | Freq: Four times a day (QID) | INTRAMUSCULAR | Status: DC | PRN
  Administered 2018-08-14: 4 mg via INTRAVENOUS
  Filled 2018-08-14: qty 2

## 2018-08-14 MED ORDER — SODIUM CHLORIDE 0.9 % IV BOLUS
1000.0000 mL | Freq: Once | INTRAVENOUS | Status: AC
  Administered 2018-08-14: 1000 mL via INTRAVENOUS

## 2018-08-14 MED ORDER — ACETAMINOPHEN 325 MG PO TABS
650.0000 mg | ORAL_TABLET | Freq: Four times a day (QID) | ORAL | Status: DC | PRN
  Administered 2018-08-15 (×2): 650 mg via ORAL
  Filled 2018-08-14 (×2): qty 2

## 2018-08-14 MED ORDER — ACETAMINOPHEN 650 MG RE SUPP: 650.0000 mg | Freq: Four times a day (QID) | RECTAL | Status: DC | PRN

## 2018-08-14 MED ORDER — IOPAMIDOL (ISOVUE-370) INJECTION 76%
100.0000 mL | Freq: Once | INTRAVENOUS | Status: AC | PRN
  Administered 2018-08-14: 100 mL via INTRAVENOUS

## 2018-08-14 MED ORDER — ONDANSETRON HCL 4 MG PO TABS
4.0000 mg | ORAL_TABLET | Freq: Four times a day (QID) | ORAL | Status: DC | PRN
  Filled 2018-08-14: qty 1

## 2018-08-14 MED ORDER — SODIUM CHLORIDE 0.9 % IV SOLN
INTRAVENOUS | Status: DC
  Administered 2018-08-14: 21:00:00 via INTRAVENOUS

## 2018-08-14 MED ORDER — ALBUTEROL SULFATE (2.5 MG/3ML) 0.083% IN NEBU: 2.5000 mg | INHALATION_SOLUTION | Freq: Four times a day (QID) | RESPIRATORY_TRACT | Status: DC | PRN

## 2018-08-14 MED ORDER — SERTRALINE HCL 50 MG PO TABS
50.0000 mg | ORAL_TABLET | Freq: Every day | ORAL | Status: DC
  Administered 2018-08-14 – 2018-08-15 (×2): 50 mg via ORAL
  Filled 2018-08-14 (×2): qty 1

## 2018-08-14 MED ORDER — HEPARIN (PORCINE) 25000 UT/250ML-% IV SOLN
1600.0000 [IU]/h | INTRAVENOUS | Status: DC
  Administered 2018-08-14: 1400 [IU]/h via INTRAVENOUS
  Filled 2018-08-14: qty 250

## 2018-08-14 MED ORDER — MORPHINE SULFATE (PF) 4 MG/ML IV SOLN
4.0000 mg | Freq: Once | INTRAVENOUS | Status: AC
  Administered 2018-08-14: 4 mg via INTRAVENOUS
  Filled 2018-08-14: qty 1

## 2018-08-14 NOTE — ED Provider Notes (Addendum)
[facility] EMERGENCY DEPARTMENT Provider Note   CSN: [id number] Arrival date & time: 08/14/18  1348     History   Chief Complaint Chief Complaint  Patient presents with  . Dizziness  . Nausea    HPI Christina Simmons is a 32 y.o. female.  The history is provided by the patient and medical records. No language interpreter was used.  Dizziness  Associated symptoms: nausea and vomiting   Associated symptoms: no headaches    Christina Simmons is a 32 y.o. female  with a PMH of factor V, previous DVT/PE, recent D&C after miscarriage who presents to the Emergency Department complaining of lightheadedness for 2 days.  Patient states that she felt as if she was having palpitations.  She thought maybe her blood sugar was low, so she ate some candy.  She felt like this might of helped a little bit.  She notes associated nausea and vomiting for the last 2 days, but none today.  Today, she developed low back pain and lower abdominal cramping.  She states that at present, her low back hurts, but her abdomen no longer hurts.  She is not nauseous right now.  No medications taken prior to arrival for symptoms.  She is supposed to be on Lovenox shots and transition to Eliquis, however she states that she has been noncompliant with this.  She does have an IVC filter in place, so she thought that it would be unlikely she would get a blood clot.  Today, her feelings of lightheadedness and palpitations worsened, prompting her to come to the emergency department.  She denies any chest pain or shortness of breath.   Past Medical History:  Diagnosis Date  . Bilateral pulmonary embolism (HCC)   . Depression with anxiety   . DVT (deep venous thrombosis) [facility] August 2016   RLE  . Factor V deficiency (HCC)   . GERD (gastroesophageal reflux disease)   . Obesity     Patient Active Problem List   Diagnosis Date Noted  . 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  SOB (shortness of breath) 04/08/2015  . DVT (deep venous thrombosis) (HCC)   . Sinus tachycardia   . Hypoxemia   . Pulmonary embolism and infarction (HCC) 04/07/2015  . DVT of lower extremity (deep venous thrombosis) (HCC) 04/07/2015  . Depression with anxiety 04/07/2015  . Morbid obesity (HCC) 04/07/2015  . Dysuria 04/07/2015    Past Surgical History:  Procedure Laterality Date  . INSERTION OF VENA CAVA FILTER    . PERIPHERAL VASCULAR THROMBECTOMY       OB History    Gravida  4   Para  2   Term      Preterm  2   AB  2   Living  2     SAB  2   TAB      Ectopic      Multiple      Live Births               Home Medications    Prior to Admission medications   Medication Sig Start Date End Date Taking? Authorizing Provider  apixaban (ELIQUIS) 5 MG TABS tablet Take 5 mg by mouth 2 (two) times daily.   Yes [provider]  norethindrone-ethinyl estradiol (MICROGESTIN,JUNEL,LOESTRIN) 1-20 MG-MCG tablet Take 1 tablet by mouth daily. 08/02/18 08/02/19 Yes [provider]  sertraline (ZOLOFT) 50 MG tablet Take 50 mg by mouth daily.  Yes [provider]  oxyCODONE (OXY IR/ROXICODONE) 5 MG immediate release tablet Take 1 tablet (5 mg total) by mouth every 3 (three) hours as needed for moderate pain, severe pain or breakthrough pain. Patient not taking: Reported on 08/14/2018 04/12/15   Dorothea OgleMyers, Iskra M, MD    Family History No family history on file.  Social History Social History   Tobacco Use  . Smoking status: Never Smoker  . Smokeless tobacco: Never Used  Substance Use Topics  . Alcohol use: No  . Drug use: No     Allergies   Amoxicillin   Review of Systems Review of Systems  Constitutional: Negative for chills and fever.  Gastrointestinal: Positive for abdominal pain, nausea and vomiting.  Genitourinary: Negative for dysuria.  Neurological: Positive for light-headedness. Negative for dizziness, syncope, numbness and  headaches.  All other systems reviewed and are negative.    Physical Exam Updated Vital Signs BP 128/83   Pulse 96   Temp 98 F (36.7 C) (Oral)   Resp 15   SpO2 100%   Physical Exam Vitals signs and nursing note reviewed.  Constitutional:      General: She is not in acute distress.    Appearance: She is well-developed.  HENT:     Head: Normocephalic and atraumatic.  Cardiovascular:     Heart sounds: Normal heart sounds. No murmur.     Comments: Tachycardic, but regular. Pulmonary:     Effort: Pulmonary effort is normal. No respiratory distress.     Breath sounds: Normal breath sounds.  Abdominal:     General: There is no distension.     Palpations: Abdomen is soft.     Comments: No abdominal tenderness.  Musculoskeletal:     Comments: Diffuse tenderness across the low back.  Straight leg raises negative bilaterally. 5/5 muscle strength to lower extremities.  Skin:    General: Skin is warm and dry.  Neurological:     Mental Status: She is alert and oriented to person, place, and time.     Comments: Bilateral lower extremities neurovascularly intact.      ED Treatments / Results  Labs (all labs ordered are listed, but only abnormal results are displayed) Labs Reviewed  CBC - Abnormal; Notable for the following components:      Result Value   WBC 13.2 (*)    Hemoglobin 11.0 (*)    MCH 24.2 (*)    MCHC 29.3 (*)    All other components within normal limits  URINALYSIS, ROUTINE W REFLEX MICROSCOPIC - Abnormal; Notable for the following components:   APPearance HAZY (*)    Hgb urine dipstick MODERATE (*)    Protein, ur 30 (*)    Bacteria, UA RARE (*)    All other components within normal limits  COMPREHENSIVE METABOLIC PANEL - Abnormal; Notable for the following components:   CO2 21 (*)    Glucose, Bld 108 (*)    All other components within normal limits  I-STAT BETA HCG BLOOD, ED ([facility], [facility], [facility] ONLY) - Abnormal; Notable for the following components:   I-stat  hCG, quantitative 5.8 (*)    All other components within normal limits  CBC  BASIC METABOLIC PANEL  TYPE AND SCREEN    EKG EKG Interpretation  Date/Time:  Monday August 14 2018 14:02:27 EST Ventricular Rate:  135 PR Interval:  132 QRS Duration: 68 QT Interval:  278 QTC Calculation: 417 R Axis:   79 Text Interpretation:  Sinus tachycardia Otherwise normal ECG [id number]  Otherwise no significant change Confirmed by Melene Plan [phone]) on 08/14/2018 4:12:23 PM   Radiology Ct Angio Chest Pe W And/or Wo Contrast  Addendum Date: 08/14/2018   ADDENDUM REPORT: 08/14/2018 18:26 ADDENDUM: It should be noted that some of the decreased attenuation in the inferior vena cava is due to admixture from left and right renal vein flow. The degree of these changes is greater than is generally seen, raising concern for a degree of thrombus in this area. It may be prudent to consider CT or MR venography with particular attention to the inferior vena cava to further assess in this regard. Electronically Signed   By: Bretta Bang III M.D.   On: 08/14/2018 18:26   Result Date: 08/14/2018 CLINICAL DATA:  Chest and abdominal pain with dizziness and nausea. History of deep venous thrombosis EXAM: CT ANGIOGRAPHY CHEST CT ABDOMEN AND PELVIS WITH CONTRAST TECHNIQUE: Multidetector CT imaging of the chest was performed using the standard protocol during bolus administration of intravenous contrast. Multiplanar CT image reconstructions and MIPs were obtained to evaluate the vascular anatomy. Multidetector CT imaging of the abdomen and pelvis was performed using the standard protocol during bolus administration of intravenous contrast. CONTRAST:  ISOVUE-370 IOPAMIDOL (ISOVUE-370) INJECTION 76% COMPARISON:  CT abdomen and pelvis April 07, 2015 FINDINGS: CTA CHEST FINDINGS Cardiovascular: There is no demonstrable pulmonary embolus. There is no thoracic aortic aneurysm or dissection. Visualized great vessels appear  normal. No pericardial effusion or pericardial thickening is evident. No evident intracardiac thrombus. Mediastinum/Nodes: Visualized thyroid appears normal. There is no appreciable thoracic adenopathy. There are no appreciable esophageal lesions. Lungs/Pleura: There is no appreciable edema or consolidation. No pleural effusion or pleural thickening evident. Musculoskeletal: There is mild thoracic dextroscoliosis. There are no blastic or lytic bone lesions. There are no evident chest wall lesions. Review of the MIP images confirms the above findings. CT ABDOMEN and PELVIS FINDINGS Hepatobiliary: No focal liver lesions are appreciable. Gallbladder wall is not appreciably thickened. There is no biliary duct dilatation. Pancreas: No pancreatic mass or inflammatory focus. Spleen: No splenic lesions are evident. Adrenals/Urinary Tract: Adrenals bilaterally appear normal. Kidneys bilaterally show no evident mass or hydronephrosis on either side. No renal calculi are evident. Contrast in the collecting systems could mask small calculi. No evident ureteral calculus on either side. Urinary bladder is midline with wall thickness within normal limits. Stomach/Bowel: There is no appreciable bowel wall or mesenteric thickening. There is no evident bowel obstruction. There is no free air or portal venous air. Vascular/Lymphatic: No abdominal aortic aneurysm. Major mesenteric vascular structures appear widely patent. There is a filter in the inferior vena cava. There is apparent thrombus in the inferior vena cava just superior to the apex of the inferior vena cava filter. No thrombus is seen in the inferior vena cava in the intrahepatic portion of the inferior vena cava. Reproductive: Uterus is anteverted.  No evident pelvic mass. Other: Appendix appears normal. There is no abscess or ascites in the abdomen or pelvis. There is a small ventral hernia containing only fat. Musculoskeletal: No blastic or lytic bone lesion evident. No  intramuscular lesion evident. Review of the MIP images confirms the above findings. IMPRESSION: CT angiogram chest: 1. No demonstrable pulmonary embolus. No thoracic aortic aneurysm or dissection. 2.  Lungs clear. 3.  No thoracic adenopathy. CT abdomen and pelvis: 1. Decreased attenuation in the inferior vena cava at the junction of the left and right renal veins, concerning for localized thrombus just superior to the apex of  an inferior vena cava filter. No other focal areas of decreased attenuation seen in the inferior vena cava. Note that the intrahepatic portions of the inferior vena cava appear normal. No other findings in venous structures of the abdomen and pelvis to suggest deep venous thrombosis. Note that the splenic and superior mesenteric veins are widely patent as are renal veins, hepatic, and portal veins. 2. No evident bowel obstruction. No abscess in the abdomen or pelvis. Appendix appears normal. 3. No evident renal or ureteral calculus. No hydronephrosis. Contrast in the renal collecting systems could obscure small intrarenal calculi. 4.  Small ventral hernia containing only fat. Electronically Signed: By: Bretta BangWilliam  Woodruff III M.D. On: 08/14/2018 17:50   Ct Abdomen Pelvis W Contrast  Addendum Date: 08/14/2018   ADDENDUM REPORT: 08/14/2018 18:26 ADDENDUM: It should be noted that some of the decreased attenuation in the inferior vena cava is due to admixture from left and right renal vein flow. The degree of these changes is greater than is generally seen, raising concern for a degree of thrombus in this area. It may be prudent to consider CT or MR venography with particular attention to the inferior vena cava to further assess in this regard. Electronically Signed   By: Bretta BangWilliam  Woodruff III M.D.   On: 08/14/2018 18:26   Result Date: 08/14/2018 CLINICAL DATA:  Chest and abdominal pain with dizziness and nausea. History of deep venous thrombosis EXAM: CT ANGIOGRAPHY CHEST CT ABDOMEN AND  PELVIS WITH CONTRAST TECHNIQUE: Multidetector CT imaging of the chest was performed using the standard protocol during bolus administration of intravenous contrast. Multiplanar CT image reconstructions and MIPs were obtained to evaluate the vascular anatomy. Multidetector CT imaging of the abdomen and pelvis was performed using the standard protocol during bolus administration of intravenous contrast. CONTRAST:  100mL ISOVUE-370 IOPAMIDOL (ISOVUE-370) INJECTION 76% COMPARISON:  CT abdomen and pelvis April 07, 2015 FINDINGS: CTA CHEST FINDINGS Cardiovascular: There is no demonstrable pulmonary embolus. There is no thoracic aortic aneurysm or dissection. Visualized great vessels appear normal. No pericardial effusion or pericardial thickening is evident. No evident intracardiac thrombus. Mediastinum/Nodes: Visualized thyroid appears normal. There is no appreciable thoracic adenopathy. There are no appreciable esophageal lesions. Lungs/Pleura: There is no appreciable edema or consolidation. No pleural effusion or pleural thickening evident. Musculoskeletal: There is mild thoracic dextroscoliosis. There are no blastic or lytic bone lesions. There are no evident chest wall lesions. Review of the MIP images confirms the above findings. CT ABDOMEN and PELVIS FINDINGS Hepatobiliary: No focal liver lesions are appreciable. Gallbladder wall is not appreciably thickened. There is no biliary duct dilatation. Pancreas: No pancreatic mass or inflammatory focus. Spleen: No splenic lesions are evident. Adrenals/Urinary Tract: Adrenals bilaterally appear normal. Kidneys bilaterally show no evident mass or hydronephrosis on either side. No renal calculi are evident. Contrast in the collecting systems could mask small calculi. No evident ureteral calculus on either side. Urinary bladder is midline with wall thickness within normal limits. Stomach/Bowel: There is no appreciable bowel wall or mesenteric thickening. There is no evident  bowel obstruction. There is no free air or portal venous air. Vascular/Lymphatic: No abdominal aortic aneurysm. Major mesenteric vascular structures appear widely patent. There is a filter in the inferior vena cava. There is apparent thrombus in the inferior vena cava just superior to the apex of the inferior vena cava filter. No thrombus is seen in the inferior vena cava in the intrahepatic portion of the inferior vena cava. Reproductive: Uterus is anteverted.  No evident pelvic  mass. Other: Appendix appears normal. There is no abscess or ascites in the abdomen or pelvis. There is a small ventral hernia containing only fat. Musculoskeletal: No blastic or lytic bone lesion evident. No intramuscular lesion evident. Review of the MIP images confirms the above findings. IMPRESSION: CT angiogram chest: 1. No demonstrable pulmonary embolus. No thoracic aortic aneurysm or dissection. 2.  Lungs clear. 3.  No thoracic adenopathy. CT abdomen and pelvis: 1. Decreased attenuation in the inferior vena cava at the junction of the left and right renal veins, concerning for localized thrombus just superior to the apex of an inferior vena cava filter. No other focal areas of decreased attenuation seen in the inferior vena cava. Note that the intrahepatic portions of the inferior vena cava appear normal. No other findings in venous structures of the abdomen and pelvis to suggest deep venous thrombosis. Note that the splenic and superior mesenteric veins are widely patent as are renal veins, hepatic, and portal veins. 2. No evident bowel obstruction. No abscess in the abdomen or pelvis. Appendix appears normal. 3. No evident renal or ureteral calculus. No hydronephrosis. Contrast in the renal collecting systems could obscure small intrarenal calculi. 4.  Small ventral hernia containing only fat. Electronically Signed: By: Bretta Bang III M.D. On: 08/14/2018 17:50    Procedures Procedures (including critical care  time)  CRITICAL CARE Performed by: Chase Picket Zayneb Baucum   Total critical care time: 35 minutes  Critical care time was exclusive of separately billable procedures and treating other patients.  Critical care was necessary to treat or prevent imminent or life-threatening deterioration. Heparin drip  Critical care was time spent personally by me on the following activities: development of treatment plan with patient and/or surrogate as well as nursing, discussions with consultants, evaluation of patient's response to treatment, examination of patient, obtaining history from patient or surrogate, ordering and performing treatments and interventions, ordering and review of laboratory studies, ordering and review of radiographic studies, pulse oximetry and re-evaluation of patient's condition.   Medications Ordered in ED Medications  morphine 4 MG/ML injection 4 mg (has no administration in time range)  sertraline (ZOLOFT) tablet 50 mg (has no administration in time range)  apixaban (ELIQUIS) tablet 5 mg (has no administration in time range)  0.9 %  sodium chloride infusion (has no administration in time range)  ondansetron (ZOFRAN) tablet 4 mg (has no administration in time range)    Or  ondansetron (ZOFRAN) injection 4 mg (has no administration in time range)  acetaminophen (TYLENOL) tablet 650 mg (has no administration in time range)    Or  acetaminophen (TYLENOL) suppository 650 mg (has no administration in time range)  albuterol (PROVENTIL) (2.5 MG/3ML) 0.083% nebulizer solution 2.5 mg (has no administration in time range)  sodium chloride 0.9 % bolus 1,000 mL (0 mLs Intravenous Stopped 08/14/18 1940)  iopamidol (ISOVUE-370) 76 % injection 100 mL (100 mLs Intravenous Contrast Given 08/14/18 1714)     Initial Impression / Assessment and Plan / ED Course  I have reviewed the triage vital signs and the nursing notes.  Pertinent labs & imaging results that were available during my care  of the patient were reviewed by me and considered in my medical decision making (see chart for details).    Christina Simmons is a 32 y.o. female who presents to ED for lightheadedness, palpitation, n/v for two days.  Tachycardic on exam.  No hypotension.  Has a history of factor V and should be on Eliquis, although  notes of noncompliance with her medication recently.  Does have an IVC filter in place.  She did have a D&C on 12/12 as well.  CT scans obtained.  No evidence of retained product on imaging.  No signs of pulmonary embolism either.  Imaging does show concern for localized thrombus just superior to the apex of her IVC filter.  I discussed findings with vascular, Dr. Durwin Nora, who stated there is no surgical intervention needed at this time.  She should continue her Eliquis if discharged.  If she was admitted to the hospital, could start on heparin, but these findings alone would not need hospital admission.  Gave her a liter of fluid.  Ambulated patient, hoping that if she tolerated this well, she could go home, however heart rate spiked to the 160s with ambulation.  Given her history and elevated heart rate with exertion, feel that it is in her best interest to observe overnight.  Discussed with hospitalist who will admit.  Patient discussed with Dr. Adela Lank who agrees with treatment plan.    Final Clinical Impressions(s) / ED Diagnoses   Final diagnoses:  Thrombus    ED Discharge Orders    None       Reid Regas, Chase Picket, PA-C 08/14/18 2039    Melene Plan, DO 08/14/18 2319    Danaisha Celli, Chase Picket, PA-C 08/15/18 0119    Melene Plan, DO 08/15/18 1752

## 2018-08-14 NOTE — ED Triage Notes (Signed)
Pt reports dizziness for the past 2 days, worse when she stands. Pt had d&c on the 12th of this month from a miscarriage. Hx of blood clots.

## 2018-08-14 NOTE — Progress Notes (Signed)
ANTICOAGULATION CONSULT NOTE - Initial Consult  Pharmacy Consult:  Heparin Indication: Acute thrombus  Allergies  Allergen Reactions  . Amoxicillin Rash and Other (See Comments)    Allergy from childhood AND the patient stated that she was told that when she took it (when she was younger) she "got worse"    Patient Measurements: Height: 5\' 4"  (162.6 cm) Weight: 300 lb (136.1 kg) IBW/kg (Calculated) : 54.7 Heparin Dosing Weight: 89 kg  Vital Signs: Temp: 98 F (36.7 C) (12/30 1400) Temp Source: Oral (12/30 1400) BP: 128/83 (12/30 1900) Pulse Rate: 96 (12/30 2000)  Labs: Recent Labs    08/14/18 1421  HGB 11.0*  HCT 37.6  PLT 219  CREATININE 0.90    Estimated Creatinine Clearance: 123.7 mL/min (by C-G formula based on SCr of 0.9 mg/dL).   Medical History: Past Medical History:  Diagnosis Date  . Bilateral pulmonary embolism (HCC)   . Depression with anxiety   . DVT (deep venous thrombosis) [facility](HCC) August 2016   RLE  . Factor V deficiency (HCC)   . GERD (gastroesophageal reflux disease)   . 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  Patient reports she was taken off of Eliquis since September when she became pregnant.  She was transitioned to Lovenox, but was off of it on 07/26/18 for [facility] LPD[facility]C of her miscarriage.  Patient did not resume AC after the procedure until 08/13/18 PM.  She presented with dizziness x 2 days.  CT negative for PE but positive for possible thrombus superior to the apex of the IVC filter placed in 2016 when she had her first VTEs.  LFTs and CBC stable.  Only spotting since the D&C but no overt bleeding.  Goal of Therapy:  Heparin level 0.3-0.7 units/ml aPTT 66 - 102 seconds Monitor platelets by anticoagulation protocol: Yes   Plan:  D/C Eliquis Heparin gtt at 1400 units/hr, no bolus  Check 6 hr aPTT/heparin level Daily heparin level, aPTT and CBC Monitor closely for s/sx of  bleeding   Katryna Tschirhart D. Laney Potashang, PharmD, BCPS, BCCCP 08/14/2018, 8:55 PM

## 2018-08-14 NOTE — ED Notes (Signed)
Patient transported to CT 

## 2018-08-14 NOTE — H&P (Signed)
History and Physical    Christina Simmons BJY:[id number] DOB: [birth year] DOA: 08/14/2018  Referring MD/NP/PA: Shanna CiscoJamie Ward, PA-C PCP: Hurshel PartyMoon, Amy A, NP  Patient coming from: Home  Chief Complaint: Dizziness  I have personally briefly reviewed patient's old medical records in [facility]   HPI: Christina Kathalene FramesM Simmons is a 32 y.o. female with medical history significant of morbid obesity, factor V deficiency, DVT/PE, s/p IVC filter, and on chronic anticoagulation; who presents with complaints of dizziness for the last 2 d ays.  Whenever standing up and ambulating she feels as though she may pass out.  When she is standing up she feels pain in her lower abdomen that radiates down into her back and buttock region.  Associated symptoms include nausea, vomiting, episodes of diarrhea, and palpitations.  Emesis is noted to be nonbloody and only occurs with he stands up.  She had recently had a miscarriage on December 12 and had undergone D&C.  During the procedure she reported being given antibiotics.  Since that time she had recently started on the minipill which is progesterone only.  Due to her history of blood clots in the past she has been on Eliquis, but reports that she is been intermittently compliant following the miscarriage.  She just recently started back on Eliquis yesterday night.  Patient denies having any significant fever, chest pain, shortness of breath, or loss of consciousness.   ED Course: Upon admission into the emergency department said to be afebrile, pulse 89-160s(with ambulation), and all other vital signs maintained.  Labs revealed WBC 13.2, hemoglobin 11, BUN 12, creatinine 1.9, and LFTs within normal limits.  Urinalysis did not show significant signs of infection.  CT angiogram of the chest and abdomen did not show any signs of a pulmonary embolus, but there were signs concerning for clot in the internal vena cava.  Case was discussed with Dr. Durwin Noraixon of vascular surgery who  recommended heparin if patient to be admitted.  Patient was started on a heparin drip and [facility] called to admit.  Review of Systems  Constitutional: Positive for malaise/fatigue. Negative for chills and fever.  HENT: Negative for congestion and nosebleeds.   Eyes: Negative for photophobia and pain.  Respiratory: Negative for cough and shortness of breath.   Cardiovascular: Positive for palpitations. Negative for leg swelling.  Gastrointestinal: Positive for abdominal pain, diarrhea, nausea and vomiting.  Genitourinary: Negative for hematuria and urgency.  Musculoskeletal: Negative for falls and joint pain.  Skin: Negative for itching and rash.  Neurological: Positive for dizziness. Negative for focal weakness and loss of consciousness.  Psychiatric/Behavioral: Negative for memory loss and substance abuse.    Past Medical History:  Diagnosis Date  . Bilateral pulmonary embolism (HCC)   . Depression with anxiety   . DVT (deep venous thrombosis) [facility](HCC) August 2016   RLE  . Factor V deficiency (HCC)   . GERD (gastroesophageal reflux disease)   . Obesity     Past Surgical History:  Procedure Laterality Date  . INSERTION OF VENA CAVA FILTER    . PERIPHERAL VASCULAR THROMBECTOMY       reports that she has never smoked. She has never used smokeless tobacco. She reports that she does not drink alcohol or use drugs.  Allergies  Allergen Reactions  . Amoxicillin Rash and Other (See Comments)    Allergy from childhood AND the patient stated that she was told that when she took it (when she was younger) she "got worse"    No  family history on file.  Prior to Admission medications   Medication Sig Start Date End Date Taking? Authorizing Provider  apixaban (ELIQUIS) 5 MG TABS tablet Take 5 mg by mouth 2 (two) times daily.   Yes [provider]  norethindrone-ethinyl estradiol (MICROGESTIN,JUNEL,LOESTRIN) 1-20 MG-MCG tablet Take 1 tablet by mouth daily. 08/02/18 08/02/19 Yes  [provider]  sertraline (ZOLOFT) 50 MG tablet Take 50 mg by mouth daily.   Yes [provider]  oxyCODONE (OXY IR/ROXICODONE) 5 MG immediate release tablet Take 1 tablet (5 mg total) by mouth every 3 (three) hours as needed for moderate pain, severe pain or breakthrough pain. Patient not taking: Reported on 08/14/2018 04/12/15   Dorothea Ogle, MD    Physical Exam:  Constitutional: Obese female in NAD, calm, comfortable Vitals:   08/14/18 1750 08/14/18 1800 08/14/18 1845 08/14/18 1900  BP: 133/74 133/78 121/85 128/83  Pulse: 100 89 89 91  Resp: 18 17 17 16   Temp:      TempSrc:      SpO2: 100% 100% 99% 100%   Eyes: PERRL, lids and conjunctivae normal ENMT: Mucous membranes are moist. Posterior pharynx clear of any exudate or lesions.Normal dentition.  Neck: normal, supple, no masses, no thyromegaly Respiratory: clear to auscultation bilaterally, no wheezing, no crackles. Normal respiratory effort. No accessory muscle use.  Cardiovascular: Regular rate and rhythm, no murmurs / rubs / gallops. No extremity edema. 2+ pedal pulses. No carotid bruits.  Abdomen: no tenderness, no masses palpated. No hepatosplenomegaly. Bowel sounds positive.  Musculoskeletal: no clubbing / cyanosis. No joint deformity upper and lower extremities. Good ROM, no contractures. Normal muscle tone.  Skin: no rashes, lesions, ulcers. No induration Neurologic: CN 2-12 grossly intact. Sensation intact, DTR normal. Strength 5/5 in all 4.  Psychiatric: Normal judgment and insight. Alert and oriented x 3. Normal mood.     Labs on Admission: I have personally reviewed following labs and imaging studies  CBC: Recent Labs  Lab 08/14/18 1421  WBC 13.2*  HGB 11.0*  HCT 37.6  MCV 82.8  PLT 219   Basic Metabolic Panel: Recent Labs  Lab 08/14/18 1421  NA 136  K 3.7  CL 103  CO2 21*  GLUCOSE 108*  BUN 12  CREATININE 0.90  CALCIUM 9.2   GFR: CrCl cannot be calculated (Unknown ideal  weight.). Liver Function Tests: Recent Labs  Lab 08/14/18 1421  AST 16  ALT 13  ALKPHOS 58  BILITOT 0.5  PROT 7.9  ALBUMIN 3.6   No results for input(s): LIPASE, AMYLASE in the last 168 hours. No results for input(s): AMMONIA in the last 168 hours. Coagulation Profile: No results for input(s): INR, PROTIME in the last 168 hours. Cardiac Enzymes: No results for input(s): CKTOTAL, CKMB, CKMBINDEX, TROPONINI in the last 168 hours. BNP (last 3 results) No results for input(s): PROBNP in the last 8760 hours. HbA1C: No results for input(s): HGBA1C in the last 72 hours. CBG: No results for input(s): GLUCAP in the last 168 hours. Lipid Profile: No results for input(s): CHOL, HDL, LDLCALC, TRIG, CHOLHDL, LDLDIRECT in the last 72 hours. Thyroid Function Tests: No results for input(s): TSH, T4TOTAL, FREET4, T3FREE, THYROIDAB in the last 72 hours. Anemia Panel: No results for input(s): VITAMINB12, FOLATE, FERRITIN, TIBC, IRON, RETICCTPCT in the last 72 hours. Urine analysis:    Component Value Date/Time   COLORURINE YELLOW 08/14/2018 1620   APPEARANCEUR HAZY (A) 08/14/2018 1620   LABSPEC 1.021 08/14/2018 1620   PHURINE 6.0 08/14/2018  1620   GLUCOSEU NEGATIVE 08/14/2018 1620   HGBUR MODERATE (A) 08/14/2018 1620   BILIRUBINUR NEGATIVE 08/14/2018 1620   KETONESUR NEGATIVE 08/14/2018 1620   PROTEINUR 30 (A) 08/14/2018 1620   UROBILINOGEN 0.2 04/08/2015 2115   NITRITE NEGATIVE 08/14/2018 1620   LEUKOCYTESUR NEGATIVE 08/14/2018 1620   Sepsis Labs: No results found for this or any previous visit (from the past 240 hour(s)).   Radiological Exams on Admission: Ct Angio Chest Pe W And/or Wo Contrast  Addendum Date: 08/14/2018   ADDENDUM REPORT: 08/14/2018 18:26 ADDENDUM: It should be noted that some of the decreased attenuation in the inferior vena cava is due to admixture from left and right renal vein flow. The degree of these changes is greater than is generally seen, raising  concern for a degree of thrombus in this area. It may be prudent to consider CT or MR venography with particular attention to the inferior vena cava to further assess in this regard. Electronically Signed   By: Bretta BangWilliam  Woodruff III M.D.   On: 08/14/2018 18:26   Result Date: 08/14/2018 CLINICAL DATA:  Chest and abdominal pain with dizziness and nausea. History of deep venous thrombosis EXAM: CT ANGIOGRAPHY CHEST CT ABDOMEN AND PELVIS WITH CONTRAST TECHNIQUE: Multidetector CT imaging of the chest was performed using the standard protocol during bolus administration of intravenous contrast. Multiplanar CT image reconstructions and MIPs were obtained to evaluate the vascular anatomy. Multidetector CT imaging of the abdomen and pelvis was performed using the standard protocol during bolus administration of intravenous contrast. CONTRAST:  100mL ISOVUE-370 IOPAMIDOL (ISOVUE-370) INJECTION 76% COMPARISON:  CT abdomen and pelvis April 07, 2015 FINDINGS: CTA CHEST FINDINGS Cardiovascular: There is no demonstrable pulmonary embolus. There is no thoracic aortic aneurysm or dissection. Visualized great vessels appear normal. No pericardial effusion or pericardial thickening is evident. No evident intracardiac thrombus. Mediastinum/Nodes: Visualized thyroid appears normal. There is no appreciable thoracic adenopathy. There are no appreciable esophageal lesions. Lungs/Pleura: There is no appreciable edema or consolidation. No pleural effusion or pleural thickening evident. Musculoskeletal: There is mild thoracic dextroscoliosis. There are no blastic or lytic bone lesions. There are no evident chest wall lesions. Review of the MIP images confirms the above findings. CT ABDOMEN and PELVIS FINDINGS Hepatobiliary: No focal liver lesions are appreciable. Gallbladder wall is not appreciably thickened. There is no biliary duct dilatation. Pancreas: No pancreatic mass or inflammatory focus. Spleen: No splenic lesions are evident.  Adrenals/Urinary Tract: Adrenals bilaterally appear normal. Kidneys bilaterally show no evident mass or hydronephrosis on either side. No renal calculi are evident. Contrast in the collecting systems could mask small calculi. No evident ureteral calculus on either side. Urinary bladder is midline with wall thickness within normal limits. Stomach/Bowel: There is no appreciable bowel wall or mesenteric thickening. There is no evident bowel obstruction. There is no free air or portal venous air. Vascular/Lymphatic: No abdominal aortic aneurysm. Major mesenteric vascular structures appear widely patent. There is a filter in the inferior vena cava. There is apparent thrombus in the inferior vena cava just superior to the apex of the inferior vena cava filter. No thrombus is seen in the inferior vena cava in the intrahepatic portion of the inferior vena cava. Reproductive: Uterus is anteverted.  No evident pelvic mass. Other: Appendix appears normal. There is no abscess or ascites in the abdomen or pelvis. There is a small ventral hernia containing only fat. Musculoskeletal: No blastic or lytic bone lesion evident. No intramuscular lesion evident. Review of the MIP images confirms  the above findings. IMPRESSION: CT angiogram chest: 1. No demonstrable pulmonary embolus. No thoracic aortic aneurysm or dissection. 2.  Lungs clear. 3.  No thoracic adenopathy. CT abdomen and pelvis: 1. Decreased attenuation in the inferior vena cava at the junction of the left and right renal veins, concerning for localized thrombus just superior to the apex of an inferior vena cava filter. No other focal areas of decreased attenuation seen in the inferior vena cava. Note that the intrahepatic portions of the inferior vena cava appear normal. No other findings in venous structures of the abdomen and pelvis to suggest deep venous thrombosis. Note that the splenic and superior mesenteric veins are widely patent as are renal veins, hepatic, and  portal veins. 2. No evident bowel obstruction. No abscess in the abdomen or pelvis. Appendix appears normal. 3. No evident renal or ureteral calculus. No hydronephrosis. Contrast in the renal collecting systems could obscure small intrarenal calculi. 4.  Small ventral hernia containing only fat. Electronically Signed: By: Bretta Bang III M.D. On: 08/14/2018 17:50   Ct Abdomen Pelvis W Contrast  Addendum Date: 08/14/2018   ADDENDUM REPORT: 08/14/2018 18:26 ADDENDUM: It should be noted that some of the decreased attenuation in the inferior vena cava is due to admixture from left and right renal vein flow. The degree of these changes is greater than is generally seen, raising concern for a degree of thrombus in this area. It may be prudent to consider CT or MR venography with particular attention to the inferior vena cava to further assess in this regard. Electronically Signed   By: Bretta Bang III M.D.   On: 08/14/2018 18:26   Result Date: 08/14/2018 CLINICAL DATA:  Chest and abdominal pain with dizziness and nausea. History of deep venous thrombosis EXAM: CT ANGIOGRAPHY CHEST CT ABDOMEN AND PELVIS WITH CONTRAST TECHNIQUE: Multidetector CT imaging of the chest was performed using the standard protocol during bolus administration of intravenous contrast. Multiplanar CT image reconstructions and MIPs were obtained to evaluate the vascular anatomy. Multidetector CT imaging of the abdomen and pelvis was performed using the standard protocol during bolus administration of intravenous contrast. CONTRAST:  ISOVUE-370 IOPAMIDOL (ISOVUE-370) INJECTION 76% COMPARISON:  CT abdomen and pelvis April 07, 2015 FINDINGS: CTA CHEST FINDINGS Cardiovascular: There is no demonstrable pulmonary embolus. There is no thoracic aortic aneurysm or dissection. Visualized great vessels appear normal. No pericardial effusion or pericardial thickening is evident. No evident intracardiac thrombus. Mediastinum/Nodes:  Visualized thyroid appears normal. There is no appreciable thoracic adenopathy. There are no appreciable esophageal lesions. Lungs/Pleura: There is no appreciable edema or consolidation. No pleural effusion or pleural thickening evident. Musculoskeletal: There is mild thoracic dextroscoliosis. There are no blastic or lytic bone lesions. There are no evident chest wall lesions. Review of the MIP images confirms the above findings. CT ABDOMEN and PELVIS FINDINGS Hepatobiliary: No focal liver lesions are appreciable. Gallbladder wall is not appreciably thickened. There is no biliary duct dilatation. Pancreas: No pancreatic mass or inflammatory focus. Spleen: No splenic lesions are evident. Adrenals/Urinary Tract: Adrenals bilaterally appear normal. Kidneys bilaterally show no evident mass or hydronephrosis on either side. No renal calculi are evident. Contrast in the collecting systems could mask small calculi. No evident ureteral calculus on either side. Urinary bladder is midline with wall thickness within normal limits. Stomach/Bowel: There is no appreciable bowel wall or mesenteric thickening. There is no evident bowel obstruction. There is no free air or portal venous air. Vascular/Lymphatic: No abdominal aortic aneurysm. Major mesenteric vascular  structures appear widely patent. There is a filter in the inferior vena cava. There is apparent thrombus in the inferior vena cava just superior to the apex of the inferior vena cava filter. No thrombus is seen in the inferior vena cava in the intrahepatic portion of the inferior vena cava. Reproductive: Uterus is anteverted.  No evident pelvic mass. Other: Appendix appears normal. There is no abscess or ascites in the abdomen or pelvis. There is a small ventral hernia containing only fat. Musculoskeletal: No blastic or lytic bone lesion evident. No intramuscular lesion evident. Review of the MIP images confirms the above findings. IMPRESSION: CT angiogram chest: 1. No  demonstrable pulmonary embolus. No thoracic aortic aneurysm or dissection. 2.  Lungs clear. 3.  No thoracic adenopathy. CT abdomen and pelvis: 1. Decreased attenuation in the inferior vena cava at the junction of the left and right renal veins, concerning for localized thrombus just superior to the apex of an inferior vena cava filter. No other focal areas of decreased attenuation seen in the inferior vena cava. Note that the intrahepatic portions of the inferior vena cava appear normal. No other findings in venous structures of the abdomen and pelvis to suggest deep venous thrombosis. Note that the splenic and superior mesenteric veins are widely patent as are renal veins, hepatic, and portal veins. 2. No evident bowel obstruction. No abscess in the abdomen or pelvis. Appendix appears normal. 3. No evident renal or ureteral calculus. No hydronephrosis. Contrast in the renal collecting systems could obscure small intrarenal calculi. 4.  Small ventral hernia containing only fat. Electronically Signed: By: Bretta Bang III M.D. On: 08/14/2018 17:50    EKG: Independently reviewed.  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  CT angiogram showing signs of suspected.  Vena cava thrombus. Risk factors include noncompliance with anticoagulation.  Patient currently taking progesterone only OCP.  Suspect clot could be because of abdominal and back pain. - Admit to a progressive bed - Continue heparin per pharmacy - Restart Eliquis in a.m.  Leukocytosis, WBC elevated 13.2.  Patient with recent D&C after miscarriage.  No retained products noted on CT imaging.  Urinalysis showing no significant signs of infection. - Check CRP  Dizziness: Suspect symptoms may be related to recent episodes of nausea and  vomiting.  - IV fluids as tolerated overnight  Nausea and vomiting - Symptomatic  treatment with antiemetics as needed  Factor V Leiden deficiency, history of DVT/PE: Patient history of IVC filter and reports being followed in the outpatient setting by hematology. - Restart Eliquis  Morbid obesity: BMI 51.49   DVT prophylaxis: Heparin Code Status: Full Family Communication: No family present at bedside Disposition Plan: Likely discharge home once medically stable Consults called: none  Admission status: observation  Clydie Braun MD [facility] Pager [phone]   If 7PM-7AM, please contact night-coverage [email or web address] Password [email or web address]  08/14/2018, 8:00 PM

## 2018-08-14 NOTE — ED Notes (Signed)
This RN ambulated patient. Pt's SpO2 remained at 98-100% during ambulation. Pt's heart rate went up to 161bpm with ambulation. Pt's heart rate returned to within normal limits upon sitting down and resting.

## 2018-08-15 DIAGNOSIS — Z86718 Personal history of other venous thrombosis and embolism: Secondary | ICD-10-CM

## 2018-08-15 DIAGNOSIS — R112 Nausea with vomiting, unspecified: Secondary | ICD-10-CM | POA: Diagnosis present

## 2018-08-15 DIAGNOSIS — R42 Dizziness and giddiness: Secondary | ICD-10-CM

## 2018-08-15 DIAGNOSIS — I8290 Acute embolism and thrombosis of unspecified vein: Secondary | ICD-10-CM | POA: Diagnosis not present

## 2018-08-15 DIAGNOSIS — D682 Hereditary deficiency of other clotting factors: Secondary | ICD-10-CM | POA: Diagnosis present

## 2018-08-15 LAB — BASIC METABOLIC PANEL
ANION GAP: 9 (ref 5–15)
BUN: 12 mg/dL (ref 6–20)
CO2: 20 mmol/L — ABNORMAL LOW (ref 22–32)
Calcium: 8.3 mg/dL — ABNORMAL LOW (ref 8.9–10.3)
Chloride: 109 mmol/L (ref 98–111)
Creatinine, Ser: 0.83 mg/dL (ref 0.44–1.00)
Glucose, Bld: 104 mg/dL — ABNORMAL HIGH (ref 70–99)
Potassium: 3.6 mmol/L (ref 3.5–5.1)
Sodium: 138 mmol/L (ref 135–145)

## 2018-08-15 LAB — CBC
HCT: 31.3 % — ABNORMAL LOW (ref 36.0–46.0)
Hemoglobin: 9.4 g/dL — ABNORMAL LOW (ref 12.0–15.0)
MCH: 25.1 pg — ABNORMAL LOW (ref 26.0–34.0)
MCHC: 30 g/dL (ref 30.0–36.0)
MCV: 83.5 fL (ref 80.0–100.0)
NRBC: 0 % (ref 0.0–0.2)
Platelets: 178 10*3/uL (ref 150–400)
RBC: 3.75 MIL/uL — ABNORMAL LOW (ref 3.87–5.11)
RDW: 15.6 % — ABNORMAL HIGH (ref 11.5–15.5)
WBC: 11.2 10*3/uL — AB (ref 4.0–10.5)

## 2018-08-15 LAB — HEPARIN LEVEL (UNFRACTIONATED): Heparin Unfractionated: 1.12 IU/mL — ABNORMAL HIGH (ref 0.30–0.70)

## 2018-08-15 LAB — MRSA PCR SCREENING: MRSA BY PCR: NEGATIVE

## 2018-08-15 LAB — APTT: aPTT: 49 seconds — ABNORMAL HIGH (ref 24–36)

## 2018-08-15 LAB — C-REACTIVE PROTEIN: CRP: 6.3 mg/dL — ABNORMAL HIGH (ref <1.0)

## 2018-08-15 MED ORDER — APIXABAN 5 MG PO TABS: 5.0000 mg | ORAL_TABLET | Freq: Two times a day (BID) | ORAL | 1 refills | Status: AC

## 2018-08-15 MED ORDER — APIXABAN 5 MG PO TABS
10.0000 mg | ORAL_TABLET | Freq: Two times a day (BID) | ORAL | Status: DC
  Administered 2018-08-15: 10 mg via ORAL
  Filled 2018-08-15: qty 2

## 2018-08-15 MED ORDER — APIXABAN 5 MG PO TABS: 5.0000 mg | ORAL_TABLET | Freq: Two times a day (BID) | ORAL | Status: DC

## 2018-08-15 MED ORDER — NORETHINDRONE ACET-ETHINYL EST 1-20 MG-MCG PO TABS: 1.0000 | ORAL_TABLET | Freq: Every day | ORAL | Status: DC

## 2018-08-15 NOTE — Progress Notes (Addendum)
ANTICOAGULATION CONSULT NOTE - Follow Up Consult  Pharmacy Consult:  Heparin Indication: Acute thrombus  Allergies  Allergen Reactions  . Amoxicillin Rash and Other (See Comments)    Allergy from childhood AND the patient stated that she was told that when she took it (when she was younger) she "got worse"    Patient Measurements: Height: 5\' 4"  (162.6 cm) Weight: 300 lb (136.1 kg) IBW/kg (Calculated) : 54.7 Heparin Dosing Weight: 89 kg  Vital Signs: Temp: 98.7 F (37.1 C) (12/31 0700) Temp Source: Oral (12/31 0700) BP: 126/75 (12/31 0700) Pulse Rate: 82 (12/31 0700)  Labs: Recent Labs    08/14/18 1421 08/15/18 0701  HGB 11.0* 9.4*  HCT 37.6 31.3*  PLT 219 178  APTT  --  49*  HEPARINUNFRC  --  1.12*  CREATININE 0.90 0.83    Estimated Creatinine Clearance: 134.1 mL/min (by C-G formula based on SCr of 0.83 mg/dL).   Medical History: Past Medical History:  Diagnosis Date  . Bilateral pulmonary embolism (HCC)   . Depression with anxiety   . DVT (deep venous thrombosis) [facility](HCC) August 2016   RLE  . Factor V deficiency (HCC)   . GERD (gastroesophageal reflux disease)   . Obesity       Assessment: [age] YOF with history of VTEs and Factor V Leiden on Eliquis PTA.  Patient reports she was taken off of Eliquis since September when she became pregnant.  She was transitioned to Lovenox, but [facility].  Patient did not resume AC after the procedure until 08/13/18 PM.  She presented with dizziness x 2 days.  CT negative for PE but positive for possible thrombus superior to the apex of the IVC filter placed in 2016 when she had her first VTEs.  Hgb down significantly this AM. Plt remain wnl. Currently on IV heparin at 1400 units/hr. Initial aPTT is subtherapeutic at 49  Goal of Therapy:  Heparin level 0.3-0.7 units/ml aPTT 66 - 102 seconds Monitor platelets by anticoagulation protocol: Yes   Plan:  Increase Heparin gtt to 1600  units/hr, no bolus. F/u resuming apixaban today  Check 6 hr aPTT Daily heparin level, aPTT and CBC Monitor closely for s/sx of bleeding   Vinnie LevelBenjamin Ashling Roane, PharmD., BCPS Clinical Pharmacist Clinical phone for 08/15/18 until 3:30pm: [phone] If after 3:30pm, please [facility] for unit-specific pharmacist   Addendum: Now transitioning to Eliquis. Stop IV heparin and start apixaban 10 mg twice daily x 7 days followed by apixaban 5 mg twice daily.   Vinnie LevelBenjamin Kyle Luppino, PharmD., BCPS Clinical Pharmacist Clinical phone for 08/15/18 until 3:30pm: [phone] If after 3:30pm, [facility] for unit-specific pharmacist

## 2018-08-15 NOTE — Progress Notes (Signed)
Discharge instructions reviewed with patient and spouse.  Patient given written prescription for Eliquis.  Patient transported via wheelchair to be discharged home with husband.

## 2018-08-15 NOTE — Discharge Summary (Signed)
Physician Discharge Summary  Christina PeopleCrystal M Simmons UJW:[id number]:[id number] DOB: [birth year] DOA: 08/14/2018  PCP: Hurshel PartyMoon, Amy A, NP  Admit date: 08/14/2018 Discharge date: 08/15/2018  Time spent: 50* minutes  Recommendations for Outpatient Follow-up:  1. Start taking Eliquis 10 mg twice a day for 7 days then start taking Eliquis 5 mg twice a day indefinitely unless recommended otherwise by your PCP or hematologist 2. 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  Symptoms additional work-up nausea, vomiting episodes of diarrhea and palpitations.  As per patient she stopped taking Eliquis after December 12 when she had miscarriage/D&C.  Patient also started taking norethindrone/ethinyl estradiol oral contraceptive pills a week ago. In the ED CT abdomen pelvis showed decreased attenuation in the inferior vena cava at the junction of left and right renal veins, concerning for localized thrombus just superior to apex of the inferior vena cava filter. Patient denies shortness of breath.  No chest pain She was started on IV heparin.  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  I called and discussed with hematologist Dr. Wylene MenScherrill, who recommends patient to be started back on Eliquis and follow-up with her hematologist as outpatient.  He did not feel the need to do  another CT venogram of abdomen pelvis to confirm the IVC thrombus.  As it is not going to change the management.  Patient will be started back on Eliquis.  Patient has heterozygous factor V Leiden deficiency, and she was recently started on oral contraceptive pills.  This along with her recent miscarriage/D&C increases the risk for hypercoagulable state.  I have advised patient to stop taking oral contraceptive pills and follow-up with her gynecologist as outpatient for further discussion.  Procedures:  Consultations:    Discharge Exam: Vitals:   08/15/18 0700 08/15/18 0800  BP: 126/75   Pulse: 82 80  Resp: 15 16  Temp: 98.7 F (37.1 C)   SpO2: 99% 99%    General: Appears in no acute distress Cardiovascular: S1-S2, regular Respiratory: Clear to auscultation bilaterally  Discharge Instructions   Discharge Instructions    Diet - low sodium heart healthy   Complete by:  As directed    Discharge instructions   Complete by:  As directed    Take Eliquis 10 mg twice daily for 7 days, then switch to 5 mg po twice daily from 08/22/18   Increase activity slowly   Complete by:  As directed      Allergies as of 08/15/2018      Reactions   Amoxicillin Rash, Other (See Comments)   Allergy from childhood AND the patient stated that she was told that when she took it (when she was younger) she "got worse"      Medication List    STOP taking these medications   norethindrone-ethinyl estradiol 1-20 MG-MCG tablet Commonly known as:  MICROGESTIN,JUNEL,LOESTRIN  oxyCODONE 5 MG immediate release tablet Commonly known as:  Oxy IR/ROXICODONE     TAKE these medications   apixaban 5 MG Tabs tablet Commonly known as:  ELIQUIS Take 1 tablet (5 mg total) by mouth 2 (two) times daily.   sertraline 50 MG tablet Commonly known as:  ZOLOFT Take 50 mg by mouth daily.      Allergies  Allergen Reactions  . Amoxicillin Rash and Other (See Comments)    Allergy from childhood AND the  patient stated that she was told that when she took it (when she was younger) she "got worse"   Follow-up Information    Moon, Amy A, NP Follow up in 1 week(s).   Specialty:  Internal Medicine Contact information: [address] [place] [address] [phone]            The results of significant diagnostics from this hospitalization (including imaging, microbiology, ancillary and laboratory) are listed below for reference.    Significant Diagnostic Studies: Ct Angio Chest Pe W And/or Wo Contrast  Addendum Date: 08/14/2018   ADDENDUM REPORT: 08/14/2018 18:26 ADDENDUM: It should be noted that some of the decreased attenuation in the inferior vena cava is due to admixture from left and right renal vein flow. The degree of these changes is greater than is generally seen, raising concern for a degree of thrombus in this area. It may be prudent to consider CT or MR venography with particular attention to the inferior vena cava to further assess in this regard. Electronically Signed   By: Bretta Bang III M.D.   On: 08/14/2018 18:26   Result Date: 08/14/2018 CLINICAL DATA:  Chest and abdominal pain with dizziness and nausea. History of deep venous thrombosis EXAM: CT ANGIOGRAPHY CHEST CT ABDOMEN AND PELVIS WITH CONTRAST TECHNIQUE: Multidetector CT imaging of the chest was performed using the standard protocol during bolus administration of intravenous contrast. Multiplanar CT image reconstructions and MIPs were obtained to evaluate the vascular anatomy. Multidetector CT imaging of the abdomen and pelvis was performed using the standard protocol during bolus administration of intravenous contrast. CONTRAST:  ISOVUE-370 IOPAMIDOL (ISOVUE-370) INJECTION 76% COMPARISON:  CT abdomen and pelvis April 07, 2015 FINDINGS: CTA CHEST FINDINGS Cardiovascular: There is no demonstrable pulmonary embolus. There is no thoracic aortic aneurysm or dissection. Visualized great vessels  appear normal. No pericardial effusion or pericardial thickening is evident. No evident intracardiac thrombus. Mediastinum/Nodes: Visualized thyroid appears normal. There is no appreciable thoracic adenopathy. There are no appreciable esophageal lesions. Lungs/Pleura: There is no appreciable edema or consolidation. No pleural effusion or pleural thickening evident. Musculoskeletal: There is mild thoracic dextroscoliosis. There are no blastic or lytic bone lesions. There are no evident chest wall lesions. Review of the MIP images confirms the above findings. CT ABDOMEN and PELVIS FINDINGS Hepatobiliary: No focal liver lesions are appreciable. Gallbladder wall is not appreciably thickened. There is no biliary duct dilatation. Pancreas: No pancreatic mass or inflammatory focus. Spleen: No splenic lesions are evident. Adrenals/Urinary Tract: Adrenals bilaterally appear normal. Kidneys bilaterally show no evident mass or hydronephrosis on either side. No renal calculi are evident. Contrast in the collecting systems could mask small calculi. No evident ureteral calculus on either side. Urinary bladder is midline with wall thickness within normal limits. Stomach/Bowel: There is no appreciable bowel wall or mesenteric thickening. There is no evident bowel obstruction. There is no free air or portal venous air. Vascular/Lymphatic: No abdominal aortic aneurysm. Major mesenteric vascular structures appear widely patent.  There is a filter in the inferior vena cava. There is apparent thrombus in the inferior vena cava just superior to the apex of the inferior vena cava filter. No thrombus is seen in the inferior vena cava in the intrahepatic portion of the inferior vena cava. Reproductive: Uterus is anteverted.  No evident pelvic mass. Other: Appendix appears normal. There is no abscess or ascites in the abdomen or pelvis. There is a small ventral hernia containing only fat. Musculoskeletal: No blastic or lytic bone lesion  evident. No intramuscular lesion evident. Review of the MIP images confirms the above findings. IMPRESSION: CT angiogram chest: 1. No demonstrable pulmonary embolus. No thoracic aortic aneurysm or dissection. 2.  Lungs clear. 3.  No thoracic adenopathy. CT abdomen and pelvis: 1. Decreased attenuation in the inferior vena cava at the junction of the left and right renal veins, concerning for localized thrombus just superior to the apex of an inferior vena cava filter. No other focal areas of decreased attenuation seen in the inferior vena cava. Note that the intrahepatic portions of the inferior vena cava appear normal. No other findings in venous structures of the abdomen and pelvis to suggest deep venous thrombosis. Note that the splenic and superior mesenteric veins are widely patent as are renal veins, hepatic, and portal veins. 2. No evident bowel obstruction. No abscess in the abdomen or pelvis. Appendix appears normal. 3. No evident renal or ureteral calculus. No hydronephrosis. Contrast in the renal collecting systems could obscure small intrarenal calculi. 4.  Small ventral hernia containing only fat. Electronically Signed: By: Bretta BangWilliam  Woodruff III M.D. On: 08/14/2018 17:50   Ct Abdomen Pelvis W Contrast  Addendum Date: 08/14/2018   ADDENDUM REPORT: 08/14/2018 18:26 ADDENDUM: It should be noted that some of the decreased attenuation in the inferior vena cava is due to admixture from left and right renal vein flow. The degree of these changes is greater than is generally seen, raising concern for a degree of thrombus in this area. It may be prudent to consider CT or MR venography with particular attention to the inferior vena cava to further assess in this regard. Electronically Signed   By: Bretta BangWilliam  Woodruff III M.D.   On: 08/14/2018 18:26   Result Date: 08/14/2018 CLINICAL DATA:  Chest and abdominal pain with dizziness and nausea. History of deep venous thrombosis EXAM: CT ANGIOGRAPHY CHEST CT  ABDOMEN AND PELVIS WITH CONTRAST TECHNIQUE: Multidetector CT imaging of the chest was performed using the standard protocol during bolus administration of intravenous contrast. Multiplanar CT image reconstructions and MIPs were obtained to evaluate the vascular anatomy. Multidetector CT imaging of the abdomen and pelvis was performed using the standard protocol during bolus administration of intravenous contrast. CONTRAST:  100mL ISOVUE-370 IOPAMIDOL (ISOVUE-370) INJECTION 76% COMPARISON:  CT abdomen and pelvis April 07, 2015 FINDINGS: CTA CHEST FINDINGS Cardiovascular: There is no demonstrable pulmonary embolus. There is no thoracic aortic aneurysm or dissection. Visualized great vessels appear normal. No pericardial effusion or pericardial thickening is evident. No evident intracardiac thrombus. Mediastinum/Nodes: Visualized thyroid appears normal. There is no appreciable thoracic adenopathy. There are no appreciable esophageal lesions. Lungs/Pleura: There is no appreciable edema or consolidation. No pleural effusion or pleural thickening evident. Musculoskeletal: There is mild thoracic dextroscoliosis. There are no blastic or lytic bone lesions. There are no evident chest wall lesions. Review of the MIP images confirms the above findings. CT ABDOMEN and PELVIS FINDINGS Hepatobiliary: No focal liver lesions are appreciable. Gallbladder wall is not appreciably thickened. There is  no biliary duct dilatation. Pancreas: No pancreatic mass or inflammatory focus. Spleen: No splenic lesions are evident. Adrenals/Urinary Tract: Adrenals bilaterally appear normal. Kidneys bilaterally show no evident mass or hydronephrosis on either side. No renal calculi are evident. Contrast in the collecting systems could mask small calculi. No evident ureteral calculus on either side. Urinary bladder is midline with wall thickness within normal limits. Stomach/Bowel: There is no appreciable bowel wall or mesenteric thickening. There is  no evident bowel obstruction. There is no free air or portal venous air. Vascular/Lymphatic: No abdominal aortic aneurysm. Major mesenteric vascular structures appear widely patent. There is a filter in the inferior vena cava. There is apparent thrombus in the inferior vena cava just superior to the apex of the inferior vena cava filter. No thrombus is seen in the inferior vena cava in the intrahepatic portion of the inferior vena cava. Reproductive: Uterus is anteverted.  No evident pelvic mass. Other: Appendix appears normal. There is no abscess or ascites in the abdomen or pelvis. There is a small ventral hernia containing only fat. Musculoskeletal: No blastic or lytic bone lesion evident. No intramuscular lesion evident. Review of the MIP images confirms the above findings. IMPRESSION: CT angiogram chest: 1. No demonstrable pulmonary embolus. No thoracic aortic aneurysm or dissection. 2.  Lungs clear. 3.  No thoracic adenopathy. CT abdomen and pelvis: 1. Decreased attenuation in the inferior vena cava at the junction of the left and right renal veins, concerning for localized thrombus just superior to the apex of an inferior vena cava filter. No other focal areas of decreased attenuation seen in the inferior vena cava. Note that the intrahepatic portions of the inferior vena cava appear normal. No other findings in venous structures of the abdomen and pelvis to suggest deep venous thrombosis. Note that the splenic and superior mesenteric veins are widely patent as are renal veins, hepatic, and portal veins. 2. No evident bowel obstruction. No abscess in the abdomen or pelvis. Appendix appears normal. 3. No evident renal or ureteral calculus. No hydronephrosis. Contrast in the renal collecting systems could obscure small intrarenal calculi. 4.  Small ventral hernia containing only fat. Electronically Signed: By: Bretta Bang III M.D. On: 08/14/2018 17:50    Microbiology: Recent Results (from the past 240  hour(s))  MRSA PCR Screening     Status: None   Collection Time: 08/15/18  6:26 AM  Result Value Ref Range Status   MRSA by PCR NEGATIVE NEGATIVE Final    Comment:        The GeneXpert MRSA Assay (FDA approved for NASAL specimens only), is one component of a comprehensive MRSA colonization surveillance program. It is not intended to diagnose MRSA infection nor to guide or monitor treatment for MRSA infections. Performed at [facility], [address] [place], [place] [address]      Labs: Basic Metabolic Panel: Recent Labs  Lab 08/14/18 1421 08/15/18 0701  NA 136 138  K 3.7 3.6  CL 103 109  CO2 21* 20*  GLUCOSE 108* 104*  BUN 12 12  CREATININE 0.90 0.83  CALCIUM 9.2 8.3*   Liver Function Tests: Recent Labs  Lab 08/14/18 1421  AST 16  ALT 13  ALKPHOS 58  BILITOT 0.5  PROT 7.9  ALBUMIN 3.6   No results for input(s): LIPASE, AMYLASE in the last 168 hours. No results for input(s): AMMONIA in the last 168 hours. CBC: Recent Labs  Lab 08/14/18 1421 08/15/18 0701  WBC 13.2* 11.2*  HGB  11.0* 9.4*  HCT 37.6 31.3*  MCV 82.8 83.5  PLT 219 178   Cardiac Enzymes: No results for input(s): CKTOTAL, CKMB, CKMBINDEX, TROPONINI in the last 168 hours. BNP: BNP (last 3 results) No results for input(s): BNP in the last 8760 hours.  ProBNP (last 3 results) No results for input(s): PROBNP in the last 8760 hours.  CBG: No results for input(s): GLUCAP in the last 168 hours.     Signed:  Meredeth Ide MD.  [facility] 08/15/2018, 9:36 AM

## 2018-08-15 NOTE — Discharge Instructions (Signed)
Information on my medicine - ELIQUIS (apixaban)  This medication education was reviewed with me or my healthcare representative as part of my discharge preparation.  The pharmacist that spoke with me during my hospital stay was:  Sampson SiMancheril, Deriyah Kunath G, [facility]  Why was Eliquis prescribed for you? Eliquis was prescribed to treat blood clots that may have been found in the veins of your legs (deep vein thrombosis) or in your lungs (pulmonary embolism) and to reduce the risk of them occurring again.  What do You need to know about Eliquis ? The starting dose is 10 mg (two 5 mg tablets) taken TWICE daily for the FIRST SEVEN (7) DAYS, then on (enter date)  08/22/2018  the dose is reduced to ONE 5 mg tablet taken TWICE daily.  Eliquis may be taken with or without food.   Try to take the dose about the same time in the morning and in the evening. If you have difficulty swallowing the tablet whole please discuss with your pharmacist how to take the medication safely.  Take Eliquis exactly as prescribed and DO NOT stop taking Eliquis without talking to the doctor who prescribed the medication.  Stopping may increase your risk of developing a new blood clot.  Refill your prescription before you run out.  After discharge, you should have regular check-up appointments with your healthcare provider that is prescribing your Eliquis.    What do you do if you miss a dose? If a dose of ELIQUIS is not taken at the scheduled time, take it as soon as possible on the same day and twice-daily administration should be resumed. The dose should not be doubled to make up for a missed dose.  Important Safety Information A possible side effect of Eliquis is bleeding. You should call your healthcare provider right away if you experience any of the following: ? Bleeding from an injury or your nose that does not stop. ? Unusual colored urine (red or dark brown) or unusual colored stools (red or black). ? Unusual  bruising for unknown reasons. ? A serious fall or if you hit your head (even if there is no bleeding).  Some medicines may interact with Eliquis and might increase your risk of bleeding or clotting while on Eliquis. To help avoid this, consult your healthcare provider or pharmacist prior to using any new prescription or non-prescription medications, including herbals, vitamins, non-steroidal anti-inflammatory drugs (NSAIDs) and supplements.  This website has more information on Eliquis (apixaban): http://www.eliquis.com/eliquis/home

## 2018-09-19 DIAGNOSIS — Z86711 Personal history of pulmonary embolism: Secondary | ICD-10-CM

## 2018-09-19 DIAGNOSIS — Z86718 Personal history of other venous thrombosis and embolism: Secondary | ICD-10-CM

## 2018-09-19 DIAGNOSIS — D509 Iron deficiency anemia, unspecified: Secondary | ICD-10-CM

## 2018-09-19 DIAGNOSIS — Z9223 Personal history of estrogen therapy: Secondary | ICD-10-CM

## 2018-09-19 DIAGNOSIS — D6851 Activated protein C resistance: Secondary | ICD-10-CM

## 2018-09-19 DIAGNOSIS — R9389 Abnormal findings on diagnostic imaging of other specified body structures: Secondary | ICD-10-CM

## 2018-09-19 DIAGNOSIS — Z7901 Long term (current) use of anticoagulants: Secondary | ICD-10-CM

## 2018-09-27 DIAGNOSIS — Z86718 Personal history of other venous thrombosis and embolism: Secondary | ICD-10-CM

## 2018-10-05 DIAGNOSIS — Z86718 Personal history of other venous thrombosis and embolism: Secondary | ICD-10-CM

## 2019-01-30 DIAGNOSIS — D509 Iron deficiency anemia, unspecified: Secondary | ICD-10-CM

## 2019-08-03 DIAGNOSIS — D509 Iron deficiency anemia, unspecified: Secondary | ICD-10-CM

## 2020-02-04 DIAGNOSIS — I2690 Septic pulmonary embolism without acute cor pulmonale: Secondary | ICD-10-CM | POA: Diagnosis not present

## 2020-04-13 IMAGING — CT CT ANGIO CHEST
2 of 4 series · 16 of 46 positions shown · IV contrast (iopamidol)
Comparison: CT abdomen and pelvis April 07, 2015

Addendum:
CLINICAL DATA: Chest and abdominal pain with dizziness and nausea.
History of deep venous thrombosis

EXAM:
CT ANGIOGRAPHY CHEST
CT ABDOMEN AND PELVIS WITH CONTRAST
TECHNIQUE: Multidetector CT imaging of the chest was performed using the
standard protocol during bolus administration of intravenous
contrast. Multiplanar CT image reconstructions and MIPs were
obtained to evaluate the vascular anatomy. Multidetector CT imaging
of the abdomen and pelvis was performed using the standard protocol
during bolus administration of intravenous contrast.
CONTRAST:  100mL [id number] IOPAMIDOL ([id number]) INJECTION 76%

[Series 5: a/p w/ 5mm · axial · 0.75mm/px · z∈[+968,+1418]mm · 13 of 100 slices shown]
[im 5/100  lung]
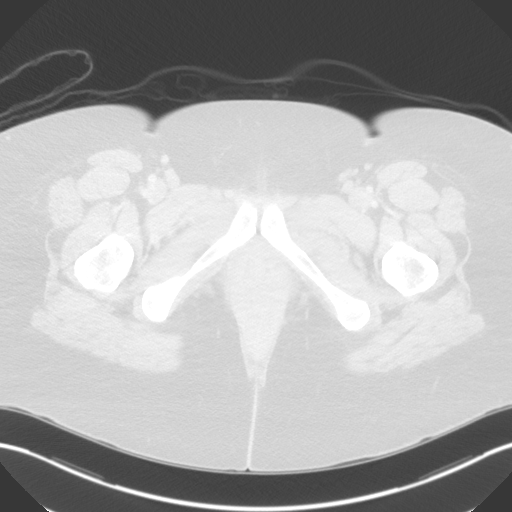
[im 13/100  soft-tissue]
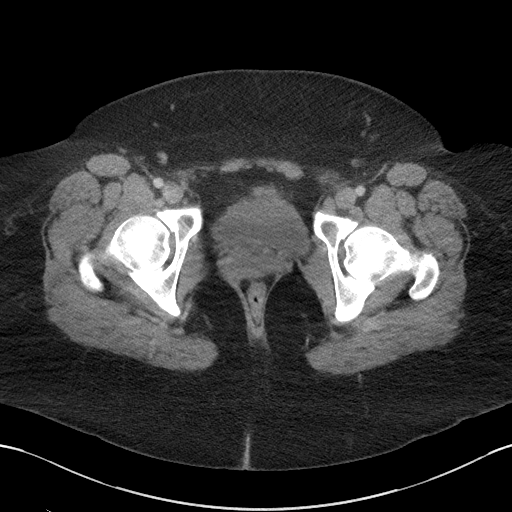
[im 21/100  lung]
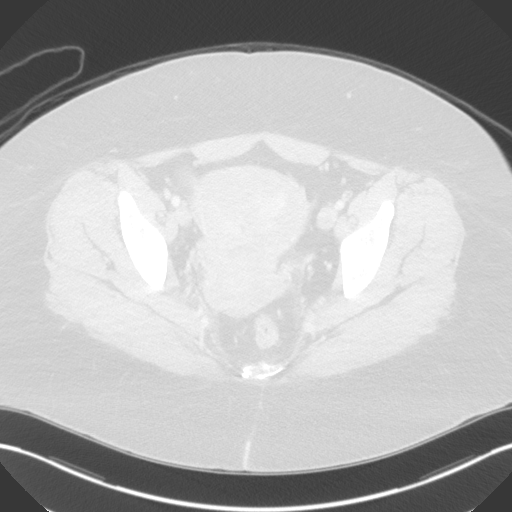
[im 29/100  soft-tissue]
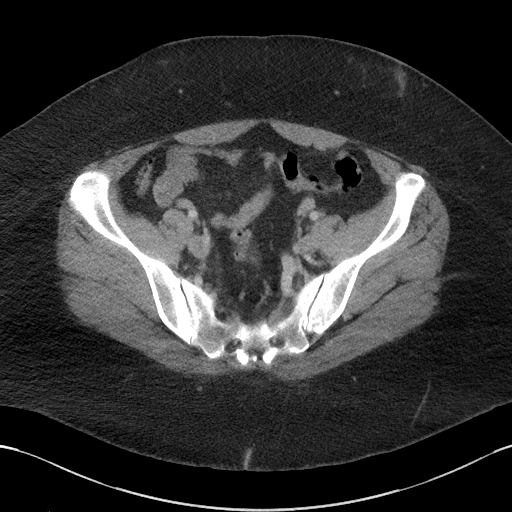
[im 34/100  lung]
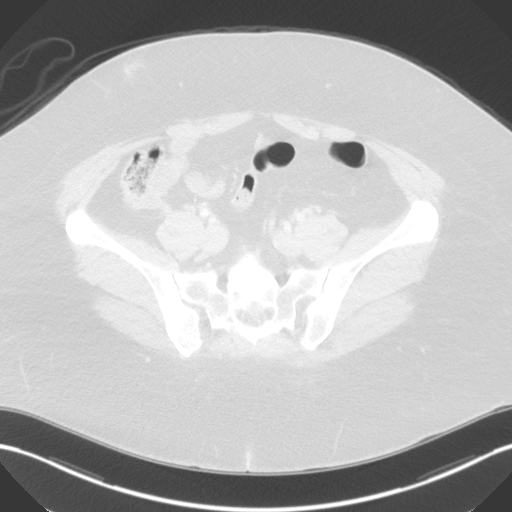
[im 42/100  soft-tissue]
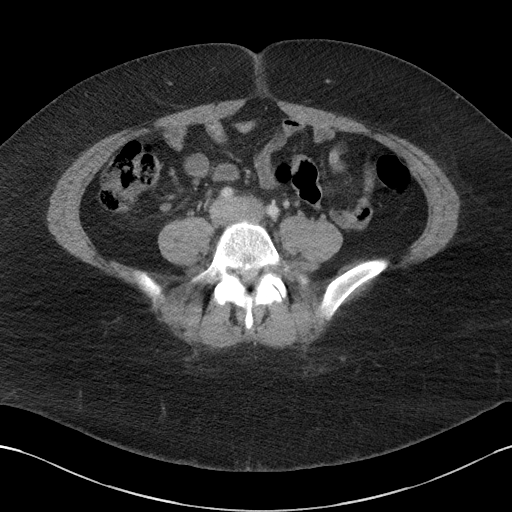
[im 50/100  lung]
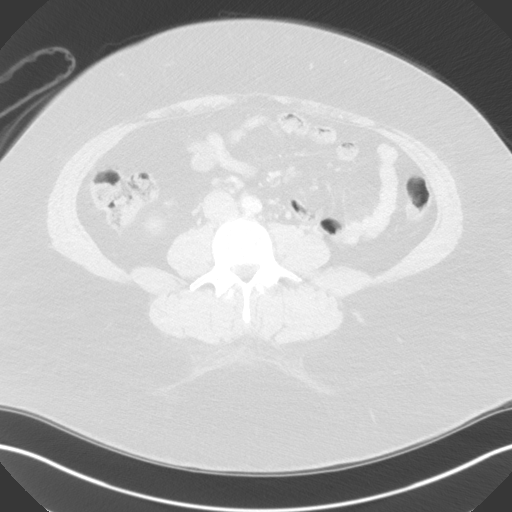
[im 58/100  soft-tissue]
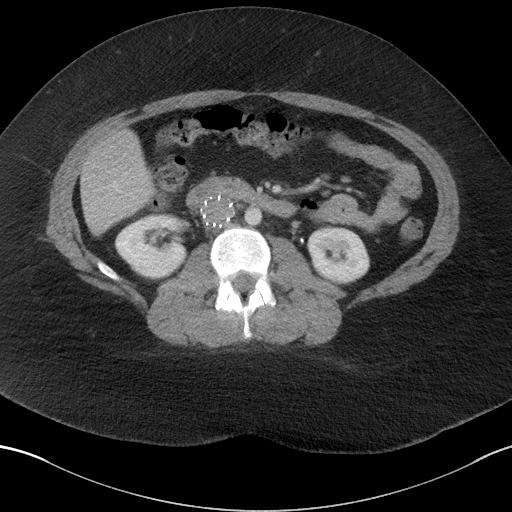
[im 67/100  lung]
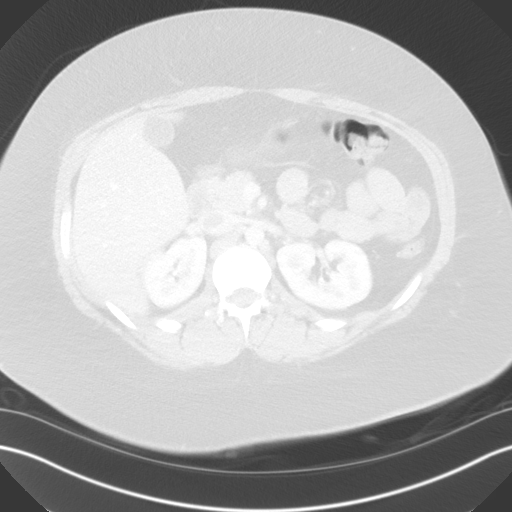
[im 71/100  soft-tissue]
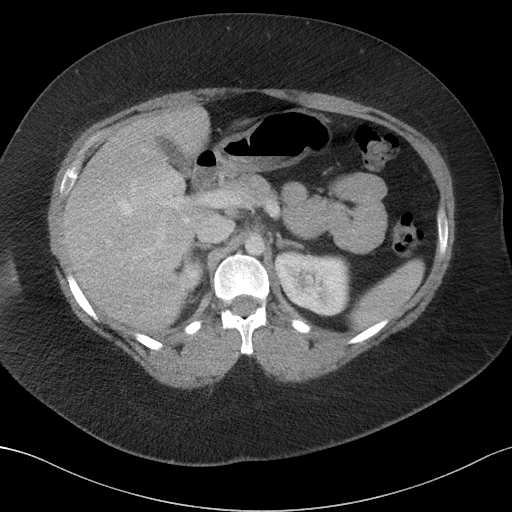
[im 79/100  lung]
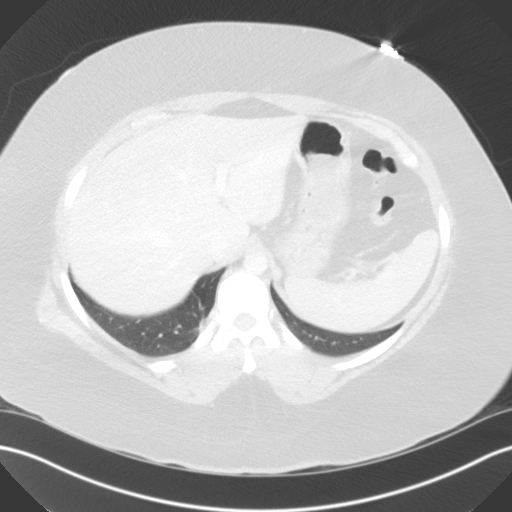
[im 87/100  soft-tissue]
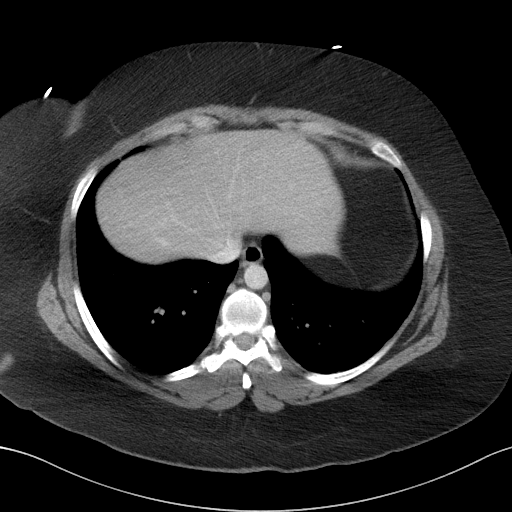
[im 95/100  lung]
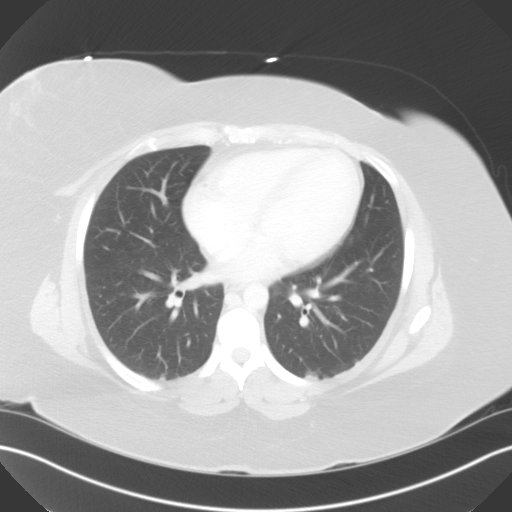

[Series 8: a/p w/ cor · coronal · 0.77mm/px · 3 of 151 slices shown]
[im 51/151  soft-tissue]
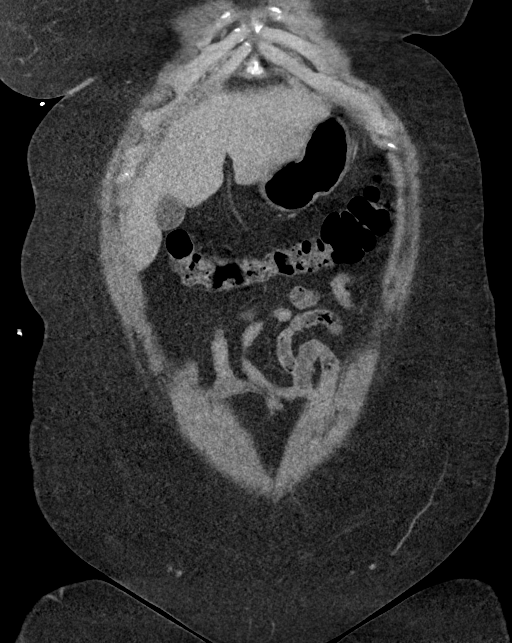
[im 67/151  soft-tissue]
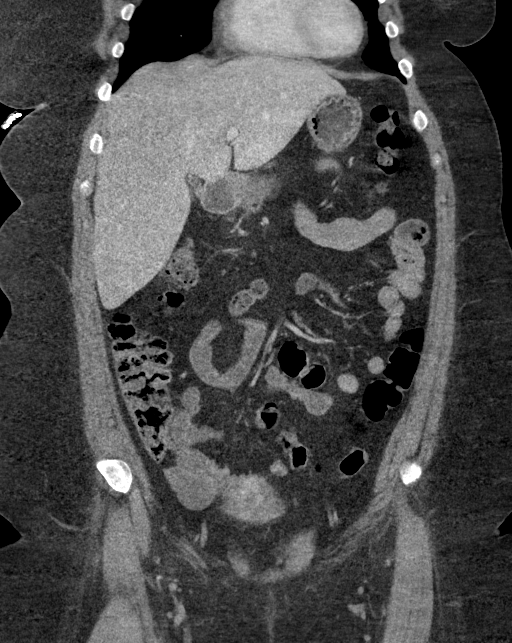
[im 84/151  soft-tissue]
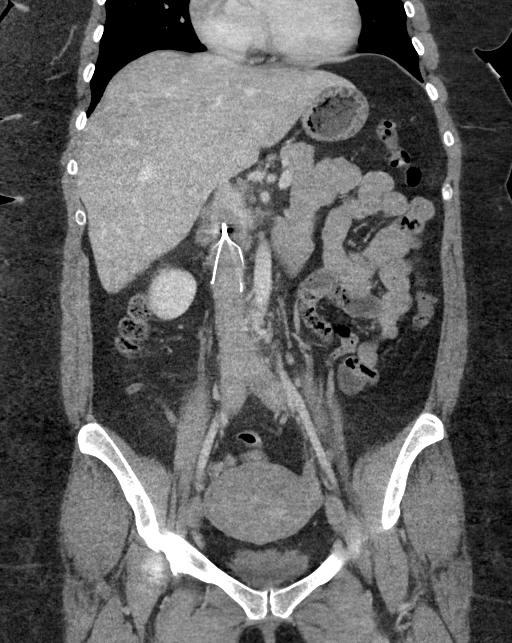

[16 of 46 positions shown; findings below may reference images not displayed]

FINDINGS: CTA CHEST FINDINGS

Cardiovascular: There is no demonstrable pulmonary embolus. There is
no thoracic aortic aneurysm or dissection. Visualized great vessels
appear normal. No pericardial effusion or pericardial thickening is
evident. No evident intracardiac thrombus.

Mediastinum/Nodes: Visualized thyroid appears normal. There is no
appreciable thoracic adenopathy. There are no appreciable esophageal
lesions.

Lungs/Pleura: There is no appreciable edema or consolidation. No
pleural effusion or pleural thickening evident.

Musculoskeletal: There is mild thoracic dextroscoliosis. There are
no blastic or lytic bone lesions. There are no evident chest wall
lesions.

Review of the MIP images confirms the above findings.

CT ABDOMEN and PELVIS FINDINGS

Hepatobiliary: No focal liver lesions are appreciable. Gallbladder
wall is not appreciably thickened. There is no biliary duct
dilatation.

Pancreas: No pancreatic mass or inflammatory focus.

Spleen: No splenic lesions are evident.

Adrenals/Urinary Tract: Adrenals bilaterally appear normal. Kidneys
bilaterally show no evident mass or hydronephrosis on either side.
No renal calculi are evident. Contrast in the collecting systems
could mask small calculi. No evident ureteral calculus on either
side. Urinary bladder is midline with wall thickness within normal
limits.

Stomach/Bowel: There is no appreciable bowel wall or mesenteric
thickening. There is no evident bowel obstruction. There is no free
air or portal venous air.

Vascular/Lymphatic: No abdominal aortic aneurysm. Major mesenteric
vascular structures appear widely patent. There is a filter in the
inferior vena cava. There is apparent thrombus in the inferior vena
cava just superior to the apex of the inferior vena cava filter. No
thrombus is seen in the inferior vena cava in the intrahepatic
portion of the inferior vena cava.

Reproductive: Uterus is anteverted.  No evident pelvic mass.

Other: Appendix appears normal. There is no abscess or ascites in
the abdomen or pelvis. There is a small ventral hernia containing
only fat.

Musculoskeletal: No blastic or lytic bone lesion evident. No
intramuscular lesion evident.

Review of the MIP images confirms the above findings.
IMPRESSION: CT angiogram chest:

1. No demonstrable pulmonary embolus. No thoracic aortic aneurysm or
dissection.

2.  Lungs clear.

3.  No thoracic adenopathy.

CT abdomen and pelvis:

1. Decreased attenuation in the inferior vena cava at the junction
of the left and right renal veins, concerning for localized thrombus
just superior to the apex of an inferior vena cava filter. No other
focal areas of decreased attenuation seen in the inferior vena cava.
Note that the intrahepatic portions of the inferior vena cava appear
normal. No other findings in venous structures of the abdomen and
pelvis to suggest deep venous thrombosis. Note that the splenic and
superior mesenteric veins are widely patent as are renal veins,
hepatic, and portal veins.

2. No evident bowel obstruction. No abscess in the abdomen or
pelvis. Appendix appears normal.

3. No evident renal or ureteral calculus. No hydronephrosis.
Contrast in the renal collecting systems could obscure small
intrarenal calculi.

4.  Small ventral hernia containing only fat.

ADDENDUM:
It should be noted that some of the decreased attenuation in the
inferior vena cava is due to admixture from left and right renal
vein flow. The degree of these changes is greater than is generally
seen, raising concern for a degree of thrombus in this area. It may
be prudent to consider CT or MR venography with particular attention
to the inferior vena cava to further assess in this regard.

*** End of Addendum ***

## 2021-07-15 ENCOUNTER — Telehealth: Payer: Self-pay

## 2021-07-15 NOTE — Telephone Encounter (Signed)
Pt LVM on nurse line stating that she has started having groin pain on the right side, for the last month. The pain is worse when she lifts her right leg. She is asking if Dr Melvyn Neth would order her a scan to see if she has another blood clot.  I attempted call to pt, to tell her that Dr Melvyn Neth discharged her from facility back to her PCP in 2021. Pt needs to see her PCP and let them assess & recommend treatment. However, no answer & voicemail box is full.
